# Patient Record
Sex: Male | Born: 1935 | Race: White | Hispanic: No | Marital: Married | Smoking: Never smoker
Health system: Southern US, Community
[De-identification: ages and names within clinical notes are randomized; demographics above are authoritative.]

## PROBLEM LIST (undated history)

## (undated) DIAGNOSIS — E079 Disorder of thyroid, unspecified: Secondary | ICD-10-CM

## (undated) HISTORY — PX: APPENDECTOMY: SHX54

## (undated) HISTORY — PX: TONSILLECTOMY: SUR1361

---

## 2013-01-13 ENCOUNTER — Encounter (HOSPITAL_COMMUNITY): Payer: Self-pay | Admitting: *Deleted

## 2013-01-13 ENCOUNTER — Emergency Department (HOSPITAL_COMMUNITY): Payer: Medicare Other

## 2013-01-13 ENCOUNTER — Emergency Department (HOSPITAL_COMMUNITY)
Admission: EM | Admit: 2013-01-13 | Discharge: 2013-01-13 | Disposition: A | Discharge status: Home / Self Care | Payer: Medicare Other | Attending: Emergency Medicine | Admitting: Emergency Medicine

## 2013-01-13 DIAGNOSIS — Z7982 Long term (current) use of aspirin: Secondary | ICD-10-CM | POA: Insufficient documentation

## 2013-01-13 DIAGNOSIS — E079 Disorder of thyroid, unspecified: Secondary | ICD-10-CM | POA: Insufficient documentation

## 2013-01-13 DIAGNOSIS — K5900 Constipation, unspecified: Secondary | ICD-10-CM

## 2013-01-13 DIAGNOSIS — R109 Unspecified abdominal pain: Secondary | ICD-10-CM

## 2013-01-13 DIAGNOSIS — Z79899 Other long term (current) drug therapy: Secondary | ICD-10-CM | POA: Insufficient documentation

## 2013-01-13 DIAGNOSIS — Z9089 Acquired absence of other organs: Secondary | ICD-10-CM | POA: Insufficient documentation

## 2013-01-13 DIAGNOSIS — R1011 Right upper quadrant pain: Secondary | ICD-10-CM | POA: Insufficient documentation

## 2013-01-13 DIAGNOSIS — R142 Eructation: Secondary | ICD-10-CM | POA: Insufficient documentation

## 2013-01-13 DIAGNOSIS — R141 Gas pain: Secondary | ICD-10-CM | POA: Insufficient documentation

## 2013-01-13 HISTORY — DX: Disorder of thyroid, unspecified: E07.9

## 2013-01-13 LAB — CBC WITH DIFFERENTIAL/PLATELET
Basophils Absolute: 0 K/uL (ref 0.0–0.1)
Basophils Relative: 1 % (ref 0–1)
Eosinophils Absolute: 0.1 K/uL (ref 0.0–0.7)
Eosinophils Relative: 2 % (ref 0–5)
HCT: 39 % (ref 39.0–52.0)
Hemoglobin: 13.6 g/dL (ref 13.0–17.0)
Lymphocytes Relative: 19 % (ref 12–46)
Lymphs Abs: 1 10*3/uL (ref 0.7–4.0)
MCH: 33.1 pg (ref 26.0–34.0)
MCHC: 34.9 g/dL (ref 30.0–36.0)
MCV: 94.9 fL (ref 78.0–100.0)
Monocytes Absolute: 0.4 K/uL (ref 0.1–1.0)
Monocytes Relative: 8 % (ref 3–12)
Neutro Abs: 3.7 10*3/uL (ref 1.7–7.7)
Neutrophils Relative %: 71 % (ref 43–77)
Platelets: 161 K/uL (ref 150–400)
RBC: 4.11 MIL/uL — ABNORMAL LOW (ref 4.22–5.81)
RDW: 12.5 % (ref 11.5–15.5)
WBC: 5.2 K/uL (ref 4.0–10.5)

## 2013-01-13 LAB — COMPREHENSIVE METABOLIC PANEL
Albumin: 3.6 g/dL (ref 3.5–5.2)
Alkaline Phosphatase: 55 U/L (ref 39–117)
BUN: 15 mg/dL (ref 6–23)
Chloride: 101 mEq/L (ref 96–112)
GFR calc Af Amer: >90 mL/min (ref >90)
Glucose, Bld: 100 mg/dL — ABNORMAL HIGH (ref 70–99)
Potassium: 4.4 mEq/L (ref 3.5–5.1)
Total Bilirubin: 0.3 mg/dL (ref 0.3–1.2)

## 2013-01-13 LAB — COMPREHENSIVE METABOLIC PANEL WITH GFR
ALT: 18 U/L (ref 0–53)
AST: 18 U/L (ref 0–37)
CO2: 29 meq/L (ref 19–32)
Calcium: 9.4 mg/dL (ref 8.4–10.5)
Creatinine, Ser: 0.83 mg/dL (ref 0.50–1.35)
GFR calc non Af Amer: 82 mL/min — ABNORMAL LOW (ref >90)
Sodium: 139 meq/L (ref 135–145)
Total Protein: 7.1 g/dL (ref 6.0–8.3)

## 2013-01-13 LAB — URINALYSIS, ROUTINE W REFLEX MICROSCOPIC
Bilirubin Urine: NEGATIVE
Glucose, UA: NEGATIVE mg/dL
Hgb urine dipstick: NEGATIVE
Ketones, ur: NEGATIVE mg/dL
Leukocytes, UA: NEGATIVE
Nitrite: NEGATIVE
Protein, ur: NEGATIVE mg/dL
Specific Gravity, Urine: 1.009 (ref 1.005–1.030)
Urobilinogen, UA: 0.2 mg/dL (ref 0.0–1.0)
pH: 7 (ref 5.0–8.0)

## 2013-01-13 LAB — OCCULT BLOOD, POC DEVICE: Fecal Occult Bld: NEGATIVE

## 2013-01-13 MED ORDER — POLYETHYLENE GLYCOL 3350 17 GM/SCOOP PO POWD: 17.0000 g | Freq: Every day | ORAL | Status: DC

## 2013-01-13 MED ORDER — DISPOSABLE ENEMA 19-7 GM/118ML RE ENEM: 1.0000 | ENEMA | Freq: Once | RECTAL | Status: DC

## 2013-01-13 MED ORDER — BISACODYL 10 MG RE SUPP
10.0000 mg | Freq: Once | RECTAL | Status: AC
  Administered 2013-01-13: 10 mg via RECTAL
  Filled 2013-01-13: qty 1

## 2013-01-13 MED ORDER — MAGNESIUM CITRATE PO SOLN
1.0000 | Freq: Once | ORAL | Status: AC
  Administered 2013-01-13: 1 via ORAL
  Filled 2013-01-13: qty 296

## 2013-01-13 MED ORDER — BISACODYL 5 MG PO TBEC
5.0000 mg | DELAYED_RELEASE_TABLET | Freq: Once | ORAL | Status: AC
  Administered 2013-01-13: 5 mg via ORAL
  Filled 2013-01-13: qty 1

## 2013-01-13 NOTE — ED Provider Notes (Signed)
History     CSN: [id number]  Arrival date & time 01/13/13  1437   First MD Initiated Contact with Patient 01/13/13 1518      No chief complaint on file.   (Consider location/radiation/quality/duration/timing/severity/associated sxs/prior treatment) HPI Comments: Patient reports that he has not had a bowel movement that was normal in the last 5 or 6 days. He normally goes daily possibly skips a day on occasion. He denies any significant prior problems with constipation. He reports he's been having an intermittent pain mostly on the right side that seems positional. He reports pain gets worse if he tries to roll and lay on his left side although it may ease off after a portion of time. He reports pain is not as bad if he lays on his back or sitting up. He denies nausea vomiting, fever or chills. He reports minimal abdominal distention. He is still passing flatus. He has had a few small pebble size bowel movements a few days ago. He has tried both a stool softener and some MiraLAX yesterday with no effect. He recalls that on the day that he began having some discomfort, he had been trying to cut a tree down with a chainsaw and was in a stooped position straining and thought that may have caused him to have some sort of bowel obstruction. Patient has a history of an appendectomy in the 1950s. Otherwise no other abdominal surgeries. He takes a medication, namely vitamins and otherwise takes medication for acid reflux and for hypothyroidism. Patient denies smoking or drinking.  The history is provided by the patient and the spouse.    Past Medical History  Diagnosis Date  . Thyroid disease     Past Surgical History  Procedure Laterality Date  . Appendectomy      History reviewed. No pertinent family history.  History  Substance Use Topics  . Smoking status: Never Smoker   . Smokeless tobacco: Not on file  . Alcohol Use: No      Review of Systems  Constitutional: Negative for fever  and chills.  Gastrointestinal: Positive for abdominal pain and constipation. Negative for nausea, vomiting and blood in stool.  Genitourinary: Negative for dysuria and flank pain.  Musculoskeletal: Negative for back pain.  All other systems reviewed and are negative.    Allergies  Review of patient's allergies indicates no known allergies.  Home Medications   Current Outpatient Rx  Name  Route  Sig  Dispense  Refill  . aspirin EC 81 MG tablet   Oral   Take 81 mg by mouth daily.         . Cholecalciferol (VITAMIN D PO)   Oral   Take 1 tablet by mouth daily.         . fish oil-omega-3 fatty acids 1000 MG capsule   Oral   Take 1 g by mouth daily.         . Ginkgo Biloba (GNP GINGKO BILOBA EXTRACT PO)   Oral   Take 1 capsule by mouth daily.         Marland Kitchen levothyroxine (SYNTHROID, LEVOTHROID) 75 MCG tablet   Oral   Take 75 mcg by mouth daily.         Marland Kitchen omeprazole (PRILOSEC) 20 MG capsule   Oral   Take 20 mg by mouth 2 (two) times daily.         . vitamin B-12 (CYANOCOBALAMIN) 1000 MCG tablet   Oral   Take 1,000 mcg by mouth daily.         Marland Kitchen  vitamin C (ASCORBIC ACID) 250 MG tablet   Oral   Take 250 mg by mouth daily.         . vitamin E 100 UNIT capsule   Oral   Take 100 Units by mouth daily.         . polyethylene glycol powder (MIRALAX) powder   Oral   Take 17 g by mouth daily.   255 g   0   . sodium phosphate (FLEET) enema   Rectal   Place 1 enema rectally once. follow package directions   135 mL   0     BP 108/79  Pulse 72  Temp(Src) 97.6 F (36.4 C) (Oral)  Resp 16  SpO2 98%  Physical Exam  Nursing note and vitals reviewed. Constitutional: He appears well-developed and well-nourished. He is cooperative.  Non-toxic appearance. He does not have a sickly appearance. No distress.  HENT:  Head: Normocephalic and atraumatic.  Eyes: No scleral icterus.  Cardiovascular: Normal rate, regular rhythm and intact distal pulses.     Pulmonary/Chest: Effort normal. No respiratory distress. He has no wheezes.  Abdominal: Soft. Normal appearance. He exhibits no distension. There is no hepatosplenomegaly. There is tenderness. There is no rebound, no guarding, no CVA tenderness, no tenderness at McBurney's point and negative Murphy's sign. No hernia.    Genitourinary: Rectal exam shows no external hemorrhoid and no internal hemorrhoid. Guaiac negative stool.  Chaperone present. Mild amount of firm stool felt on digital rectal examination without overt obstruction. No sig impaction.    Neurological: He is alert.  Skin: Skin is warm and dry. He is not diaphoretic.    ED Course  Procedures (including critical care time)  Labs Reviewed  CBC WITH DIFFERENTIAL - Abnormal; Notable for the following:    RBC 4.11 (*)    All other components within normal limits  COMPREHENSIVE METABOLIC PANEL - Abnormal; Notable for the following:    Glucose, Bld 100 (*)    GFR calc non Af Amer 82 (*)    All other components within normal limits  URINALYSIS, ROUTINE W REFLEX MICROSCOPIC  OCCULT BLOOD, POC DEVICE   Dg Abd Acute W/chest  01/13/2013  *RADIOLOGY REPORT*  Clinical Data: Pain in bilateral flanks with constipation for 5 days  ACUTE ABDOMEN SERIES (ABDOMEN 2 VIEW & CHEST 1 VIEW)  Comparison: None.  Findings: Normal heart size and pulmonary vascularity. Tortuous aorta. Lungs clear. No pleural effusion or pneumothorax. Increased stool throughout colon to rectum. No bowel dilatation, bowel wall thickening, or free intraperitoneal air. Osseous demineralization with compression deformities of multiple vertebral endplates on AP views.  Scattered costal cartilaginous calcifications. Additional left paraspinal calcification at the L3-L4 disc space level on supine view is likely located over the L4-L5 disc space on the AP view. This calcification is nonspecific and of uncertain etiology. This is not likely associated with urinary tract. No definite  urinary tract calcification.  IMPRESSION: Increased stool throughout colon, clinically consider constipation. Nonspecific left paraspinal calcifications at L3-L4, of uncertain etiology and clinical significance. These are not likely related to the urinary tract or the abdominal aorta. Calcified intra abdominal mass is not excluded; follow-up CT imaging with IV and oral contrast recommended.   Original Report Authenticated By: Ulyses Southward, M.D.      1. Constipation   2. Abdominal pain     ra sat is 98% and I interpret to be normal.  8:26 PM Clinically, I suspect constipation.  Pt received laxatives and I discussed all  findings from plain films with pt and spouse esp regarding need for outpt CT scan or MRI to delineate calcifications. Ca is normal here.  Abd is soft.  Will d/c home.  Pt told to return if pain worsens, is associated with N/V, or high fevers. Pt and spouse are in agreement.    MDM  Pt with no distention, no tympanitic BS, no report of N/V.  Abd pain is intermittent and positional and I think more consistent with pain associated with constipation.  Will check 3 view abd series. If this displays sig stool burden, esp in right side, will try laxatives here for a BM and to reassess for abd pain resolution.  Otherwise would obtain a CT Scan.          Gavin Pound. Oletta Lamas, MD 01/13/13 2042

## 2013-01-13 NOTE — Discharge Instructions (Signed)
Constipation, Adult  Constipation is when a person has fewer than 3 bowel movements a week; has difficulty having a bowel movement; or has stools that are dry, hard, or larger than normal. As people grow older, constipation is more common. If you try to fix constipation with medicines that make you have a bowel movement (laxatives), the problem may get worse. Long-term laxative use may cause the muscles of the colon to become weak. A low-fiber diet, not taking in enough fluids, and taking certain medicines may make constipation worse.  CAUSES    Certain medicines, such as antidepressants, pain medicine, iron supplements, antacids, and water pills.    Certain diseases, such as diabetes, irritable bowel syndrome (IBS), thyroid disease, or depression.    Not drinking enough water.    Not eating enough fiber-rich foods.    Stress or travel.   Lack of physical activity or exercise.   Not going to the restroom when there is the urge to have a bowel movement.   Ignoring the urge to have a bowel movement.   Using laxatives too much.  SYMPTOMS    Having fewer than 3 bowel movements a week.    Straining to have a bowel movement.    Having hard, dry, or larger than normal stools.    Feeling full or bloated.    Pain in the lower abdomen.   Not feeling relief after having a bowel movement.  DIAGNOSIS   Your caregiver will take a medical history and perform a physical exam. Further testing may be done for severe constipation. Some tests may include:    A barium enema X-ray to examine your rectum, colon, and sometimes, your small intestine.   A sigmoidoscopy to examine your lower colon.   A colonoscopy to examine your entire colon.  TREATMENT   Treatment will depend on the severity of your constipation and what is causing it. Some dietary treatments include drinking more fluids and eating more fiber-rich foods. Lifestyle treatments may include regular exercise. If these diet and lifestyle  recommendations do not help, your caregiver may recommend taking over-the-counter laxative medicines to help you have bowel movements. Prescription medicines may be prescribed if over-the-counter medicines do not work.   HOME CARE INSTRUCTIONS    Increase dietary fiber in your diet, such as fruits, vegetables, whole grains, and beans. Limit high-fat and processed sugars in your diet, such as [place] fries, hamburgers, cookies, candies, and soda.    A fiber supplement may be added to your diet if you cannot get enough fiber from foods.    Drink enough fluids to keep your urine clear or pale yellow.    Exercise regularly or as directed by your caregiver.    Go to the restroom when you have the urge to go. Do not hold it.   Only take medicines as directed by your caregiver. Do not take other medicines for constipation without talking to your caregiver first.  [place] IF:    You have bright red blood in your stool.    Your constipation lasts for more than 4 days or gets worse.    You have abdominal or rectal pain.    You have thin, pencil-like stools.   You have unexplained weight loss.  MAKE SURE YOU:    Understand these instructions.   Will watch your condition.   Will get help right away if you are not doing well or get worse.  Document Released: 07/21/2004 Document Revised: 01/15/2012 Document Reviewed:  09/26/2011  ExitCare Patient Information 2013 [employer].    Abdominal Pain  Abdominal pain can be caused by many things. Your caregiver decides the seriousness of your pain by an examination and possibly blood tests and X-rays. Many cases can be observed and treated at home. Most abdominal pain is not caused by a disease and will probably improve without treatment. However, in many cases, more time must pass before a clear cause of the pain can be found. Before that point, it may not be known if you need more testing, or if hospitalization or surgery is needed.  HOME CARE INSTRUCTIONS     Do not take laxatives unless directed by your caregiver.   Take pain medicine only as directed by your caregiver.   Only take over-the-counter or prescription medicines for pain, discomfort, or fever as directed by your caregiver.   Try a clear liquid diet (broth, tea, or water) for as long as directed by your caregiver. Slowly move to a bland diet as tolerated.  SEEK IMMEDIATE MEDICAL CARE IF:    The pain does not go away.   You have a fever.   You keep throwing up (vomiting).   The pain is felt only in portions of the abdomen. Pain in the right side could possibly be appendicitis. In an adult, pain in the left lower portion of the abdomen could be colitis or diverticulitis.   You pass bloody or black tarry stools.  MAKE SURE YOU:    Understand these instructions.   Will watch your condition.   Will get help right away if you are not doing well or get worse.  Document Released: 08/02/2005 Document Revised: 01/15/2012 Document Reviewed: 06/10/2008  Exit[employer] Patient Information 2013 [employer].

## 2013-01-13 NOTE — ED Notes (Signed)
Pt states last Wednesday was working in some wood cutting a tree down.  Pt started having right and lower back pain.  Pt states that he has not had a good bowel movement in the last 5-7 days.  Pt states when he lays down he hurts.  No problems urinating.

## 2013-02-12 ENCOUNTER — Emergency Department (HOSPITAL_COMMUNITY): Payer: Medicare Other

## 2013-02-12 ENCOUNTER — Emergency Department (HOSPITAL_COMMUNITY)
Admission: EM | Admit: 2013-02-12 | Discharge: 2013-02-12 | Disposition: A | Discharge status: Home / Self Care | Payer: Medicare Other | Attending: Emergency Medicine | Admitting: Emergency Medicine

## 2013-02-12 ENCOUNTER — Encounter (HOSPITAL_COMMUNITY): Payer: Self-pay | Admitting: Emergency Medicine

## 2013-02-12 DIAGNOSIS — R42 Dizziness and giddiness: Secondary | ICD-10-CM | POA: Insufficient documentation

## 2013-02-12 DIAGNOSIS — R209 Unspecified disturbances of skin sensation: Secondary | ICD-10-CM | POA: Insufficient documentation

## 2013-02-12 DIAGNOSIS — Z79899 Other long term (current) drug therapy: Secondary | ICD-10-CM | POA: Insufficient documentation

## 2013-02-12 DIAGNOSIS — Z7982 Long term (current) use of aspirin: Secondary | ICD-10-CM | POA: Insufficient documentation

## 2013-02-12 DIAGNOSIS — E079 Disorder of thyroid, unspecified: Secondary | ICD-10-CM | POA: Insufficient documentation

## 2013-02-12 DIAGNOSIS — R110 Nausea: Secondary | ICD-10-CM | POA: Insufficient documentation

## 2013-02-12 LAB — COMPREHENSIVE METABOLIC PANEL
Albumin: 4 g/dL (ref 3.5–5.2)
BUN: 15 mg/dL (ref 6–23)
Calcium: 9.7 mg/dL (ref 8.4–10.5)
Creatinine, Ser: 0.85 mg/dL (ref 0.50–1.35)
Potassium: 4 mEq/L (ref 3.5–5.1)
Total Protein: 7.3 g/dL (ref 6.0–8.3)

## 2013-02-12 LAB — CBC WITH DIFFERENTIAL/PLATELET
Basophils Relative: 0 % (ref 0–1)
Eosinophils Absolute: 0 10*3/uL (ref 0.0–0.7)
Eosinophils Relative: 0 % (ref 0–5)
Hemoglobin: 13.5 g/dL (ref 13.0–17.0)
MCH: 32.4 pg (ref 26.0–34.0)
MCHC: 34.9 g/dL (ref 30.0–36.0)
MCV: 92.8 fL (ref 78.0–100.0)
Monocytes Absolute: 0.3 10*3/uL (ref 0.1–1.0)
Monocytes Relative: 4 % (ref 3–12)
Neutrophils Relative %: 89 % — ABNORMAL HIGH (ref 43–77)

## 2013-02-12 LAB — URINALYSIS, ROUTINE W REFLEX MICROSCOPIC
Glucose, UA: NEGATIVE mg/dL
Leukocytes, UA: NEGATIVE
Nitrite: NEGATIVE
Specific Gravity, Urine: 1.018 (ref 1.005–1.030)
pH: 6.5 (ref 5.0–8.0)

## 2013-02-12 LAB — LIPASE, BLOOD: Lipase: 30 U/L (ref 11–59)

## 2013-02-12 MED ORDER — PROMETHAZINE HCL 25 MG PO TABS: 25.0000 mg | ORAL_TABLET | Freq: Four times a day (QID) | ORAL | Status: AC | PRN

## 2013-02-12 NOTE — ED Notes (Addendum)
PT c/o acute onset nausea with dizziness at approx 1730 this evening. Pt also states his lip was numb on the drive here but that has subsided. Pt denies V/D, SOB, CP, fever/chills

## 2013-02-12 NOTE — ED Provider Notes (Signed)
History     CSN: [id number]  Arrival date & time 02/12/13  1945   First MD Initiated Contact with Patient 02/12/13 2117      Chief Complaint  Patient presents with  . Nausea    (Consider location/radiation/quality/duration/timing/severity/associated sxs/prior treatment) HPI Comments: This is a 79 year old patient who presents today with nausea. The nausea began suddenly at 445 pm. He states at this time he had some perioral numbness that resolved over half an hour. He gets lightheaded when he moves. Laying still helps this. No associated SOB, chest pain, weakness, headache, abdominal pain, vomiting. When he was in the car coming to the ED from [place] he was holding a basin and his hand went numb. That has also resolved. 2 weeks ago he began taking Colase and he wonders if he is allergic. Currently he feels well other than some mild nausea.   The history is provided by the patient. No language interpreter was used.    Past Medical History  Diagnosis Date  . Thyroid disease     Past Surgical History  Procedure Laterality Date  . Appendectomy    . Tonsillectomy      No family history on file.  History  Substance Use Topics  . Smoking status: Never Smoker   . Smokeless tobacco: Not on file  . Alcohol Use: No      Review of Systems  Respiratory: Negative for chest tightness and shortness of breath.   Cardiovascular: Negative for chest pain.  Gastrointestinal: Positive for nausea. Negative for vomiting and abdominal pain.  Neurological: Positive for numbness. Negative for syncope, weakness and headaches.  All other systems reviewed and are negative.    Allergies  Review of patient's allergies indicates no known allergies.  Home Medications   Current Outpatient Rx  Name  Route  Sig  Dispense  Refill  . aspirin EC 81 MG tablet   Oral   Take 81 mg by mouth daily.         . Cholecalciferol (VITAMIN D PO)   Oral   Take 1 tablet by mouth daily.         .  fish oil-omega-3 fatty acids 1000 MG capsule   Oral   Take 1 g by mouth daily.         . Ginkgo Biloba (GNP GINGKO BILOBA EXTRACT PO)   Oral   Take 1 capsule by mouth daily.         Marland Kitchen levothyroxine (SYNTHROID, LEVOTHROID) 75 MCG tablet   Oral   Take 75 mcg by mouth daily.         Marland Kitchen omeprazole (PRILOSEC) 20 MG capsule   Oral   Take 20 mg by mouth 2 (two) times daily.         . polyethylene glycol powder (MIRALAX) powder   Oral   Take 17 g by mouth daily.   255 g   0   . sodium phosphate (FLEET) enema   Rectal   Place 1 enema rectally once. follow package directions   135 mL   0   . vitamin B-12 (CYANOCOBALAMIN) 1000 MCG tablet   Oral   Take 1,000 mcg by mouth daily.         . vitamin C (ASCORBIC ACID) 250 MG tablet   Oral   Take 250 mg by mouth daily.         . vitamin E 100 UNIT capsule   Oral   Take 100 Units by mouth  daily.           BP 118/52  Pulse 76  Temp(Src) 97.4 F (36.3 C) (Oral)  Resp 14  SpO2 99%  Physical Exam  Nursing note and vitals reviewed. Constitutional: He is oriented to person, place, and time. He appears well-developed and well-nourished. No distress.  HENT:  Head: Normocephalic and atraumatic.  Right Ear: External ear normal.  Left Ear: External ear normal.  Nose: Nose normal.  Eyes: Conjunctivae and EOM are normal. Pupils are equal, round, and reactive to light.  Neck: Normal range of motion. No tracheal deviation present.  Cardiovascular: Normal rate, regular rhythm, normal heart sounds and intact distal pulses.   Pulmonary/Chest: Effort normal and breath sounds normal. No stridor. No respiratory distress. He has no wheezes. He has no rales.  Abdominal: Soft. Bowel sounds are normal. He exhibits no distension. There is no tenderness.  Musculoskeletal: Normal range of motion.  Neurological: He is alert and oriented to person, place, and time. He has normal strength and normal reflexes. He displays normal reflexes. No  cranial nerve deficit (III-XII) or sensory deficit. He exhibits normal muscle tone. Coordination and gait normal.  Skin: Skin is warm and dry. He is not diaphoretic.  Psychiatric: He has a normal mood and affect. His behavior is normal.    ED Course  Procedures (including critical care time)  Labs Reviewed  CBC WITH DIFFERENTIAL - Abnormal; Notable for the following:    RBC 4.17 (*)    HCT 38.7 (*)    Neutrophils Relative 89 (*)    Lymphocytes Relative 6 (*)    Lymphs Abs 0.5 (*)    All other components within normal limits  COMPREHENSIVE METABOLIC PANEL - Abnormal; Notable for the following:    Glucose, Bld 134 (*)    GFR calc non Af Amer 81 (*)    All other components within normal limits  URINALYSIS, ROUTINE W REFLEX MICROSCOPIC - Abnormal; Notable for the following:    APPearance CLOUDY (*)    Ketones, ur 15 (*)    All other components within normal limits  LIPASE, BLOOD  TROPONIN I   Ct Head Wo Contrast  02/12/2013  *RADIOLOGY REPORT*  Clinical Data: New onset nausea and dizziness.  CT HEAD WITHOUT CONTRAST  Technique:  Contiguous axial images were obtained from the base of the skull through the vertex without contrast.  Comparison: None.  Findings: Mild generalized atrophy is likely within normal limits for age.  There is minimal white matter disease.  No acute cortical infarct, hemorrhage, mass lesion is present.  The ventricles are of normal size.  No significant extra-axial fluid collection is present.  The paranasal sinuses and mastoid air cells are clear.  The osseous skull is intact.  IMPRESSION:  1.  Mild atrophy white matter disease is likely within normal limits for age. 2.  No acute intracranial abnormality.   Original Report Authenticated By: Marin Roberts, M.D.      1. Vertigo   2. Nausea       MDM  Patient presents today with sudden onset of nausea associated with perioral numbness. Room spins with movement and position change. Perioral numbness has  resolved. Neuro exam WNL. Troponin negative. CT of head negative for acute pathology. Low concern for TIA. No concern for stroke. Suspect vertigo. Tolerated ambulation well prior to discharge. Follow up with PCP. Dr. Bebe Shaggy saw this patient and agrees with plan. Return precautions given. Vital signs stable for discharge. Patient / Family / Caregiver  informed of clinical course, understand medical decision-making process, and agree with plan.         Mora Bellman, PA-C 02/13/13 1135

## 2013-02-12 NOTE — ED Notes (Signed)
Pt comfortable with d/c and f/u instructions. Prescriptions x1 

## 2013-02-12 NOTE — ED Notes (Signed)
PT. REPORTS NAUSEA ONSET THIS AFTERNOON , DENIES EMESIS OR DIARRHEA , NO PAIN OR SOB . NO FEVER OR CHILLS.

## 2013-02-12 NOTE — Discharge Instructions (Signed)
Benign Positional Vertigo  Vertigo means you feel like you or your surroundings are moving when they are not. Benign positional vertigo is the most common form of vertigo. Benign means that the cause of your condition is not serious. Benign positional vertigo is more common in older adults.  CAUSES   Benign positional vertigo is the result of an upset in the labyrinth system. This is an area in the middle ear that helps control your balance. This may be caused by a viral infection, head injury, or repetitive motion. However, often no specific cause is found.  SYMPTOMS   Symptoms of benign positional vertigo occur when you move your head or eyes in different directions. Some of the symptoms may include:  · Loss of balance and falls.  · Vomiting.  · Blurred vision.  · Dizziness.  · Nausea.  · Involuntary eye movements (nystagmus).  DIAGNOSIS   Benign positional vertigo is usually diagnosed by physical exam. If the specific cause of your benign positional vertigo is unknown, your caregiver may perform imaging tests, such as magnetic resonance imaging (MRI) or computed tomography (CT).  TREATMENT   Your caregiver may recommend movements or procedures to correct the benign positional vertigo. Medicines such as meclizine, benzodiazepines, and medicines for nausea may be used to treat your symptoms. In rare cases, if your symptoms are caused by certain conditions that affect the inner ear, you may need surgery.  HOME CARE INSTRUCTIONS   · Follow your caregiver's instructions.  · Move slowly. Do not make sudden body or head movements.  · Avoid driving.  · Avoid operating heavy machinery.  · Avoid performing any tasks that would be dangerous to you or others during a vertigo episode.  · Drink enough fluids to keep your urine clear or pale yellow.  SEEK IMMEDIATE MEDICAL CARE IF:   · You develop problems with walking, weakness, numbness, or using your arms, hands, or legs.  · You have difficulty speaking.  · You develop  severe headaches.  · Your nausea or vomiting continues or gets worse.  · You develop visual changes.  · Your family or friends notice any behavioral changes.  · Your condition gets worse.  · You have a fever.  · You develop a stiff neck or sensitivity to light.  MAKE SURE YOU:   · Understand these instructions.  · Will watch your condition.  · Will get help right away if you are not doing well or get worse.  Document Released: 07/31/2006 Document Revised: 01/15/2012 Document Reviewed: 07/13/2011  ExitCare® Patient Information ©2013 [employer].

## 2013-02-12 NOTE — ED Notes (Signed)
Dr. Wickline at bedside.  

## 2013-02-12 NOTE — ED Notes (Signed)
Pt returned from CT °

## 2013-02-12 NOTE — ED Notes (Signed)
EDPA at bedside for assessment 

## 2013-02-12 NOTE — ED Notes (Signed)
Ambulated pt around Pod pt said that he didn't feel dizzy or nauseous. Stated he felt a little weak in the knees but said it was probably from laying in the bed.

## 2013-02-14 NOTE — ED Provider Notes (Signed)
Medical screening examination/treatment/procedure(s) were conducted as a shared visit with non-physician practitioner(s) and myself.  I personally evaluated the patient during the encounter  Pt well appearing, on further review symptoms seemed c/w peripheral vertigo, no past pointing on my exam and he was ambulatory, doubt central cause of vertigo, doubt CVA.  Doubt ACS after further discussion, further troponin testing not necessary     Date: 02/12/2013  Rate: 72  Rhythm: normal sinus rhythm  QRS Axis: normal  Intervals: PR prolonged  ST/T Wave abnormalities: normal  Conduction Disutrbances:first-degree A-V block      Joya Gaskins, MD 02/14/13 [id number]

## 2015-02-22 ENCOUNTER — Emergency Department (HOSPITAL_COMMUNITY): Payer: Medicare Other

## 2015-02-22 ENCOUNTER — Encounter (HOSPITAL_COMMUNITY): Payer: Self-pay | Admitting: Emergency Medicine

## 2015-02-22 ENCOUNTER — Emergency Department (HOSPITAL_COMMUNITY)
Admission: EM | Admit: 2015-02-22 | Discharge: 2015-02-22 | Disposition: A | Discharge status: Home / Self Care | Payer: Medicare Other | Attending: Emergency Medicine | Admitting: Emergency Medicine

## 2015-02-22 DIAGNOSIS — S22009A Unspecified fracture of unspecified thoracic vertebra, initial encounter for closed fracture: Secondary | ICD-10-CM | POA: Diagnosis not present

## 2015-02-22 DIAGNOSIS — Z79899 Other long term (current) drug therapy: Secondary | ICD-10-CM | POA: Insufficient documentation

## 2015-02-22 DIAGNOSIS — Y999 Unspecified external cause status: Secondary | ICD-10-CM | POA: Insufficient documentation

## 2015-02-22 DIAGNOSIS — R109 Unspecified abdominal pain: Secondary | ICD-10-CM

## 2015-02-22 DIAGNOSIS — W1839XA Other fall on same level, initial encounter: Secondary | ICD-10-CM | POA: Diagnosis not present

## 2015-02-22 DIAGNOSIS — S3991XA Unspecified injury of abdomen, initial encounter: Secondary | ICD-10-CM | POA: Diagnosis not present

## 2015-02-22 DIAGNOSIS — Y929 Unspecified place or not applicable: Secondary | ICD-10-CM | POA: Insufficient documentation

## 2015-02-22 DIAGNOSIS — Z7982 Long term (current) use of aspirin: Secondary | ICD-10-CM | POA: Insufficient documentation

## 2015-02-22 DIAGNOSIS — E079 Disorder of thyroid, unspecified: Secondary | ICD-10-CM | POA: Insufficient documentation

## 2015-02-22 DIAGNOSIS — Y939 Activity, unspecified: Secondary | ICD-10-CM | POA: Insufficient documentation

## 2015-02-22 DIAGNOSIS — S299XXA Unspecified injury of thorax, initial encounter: Secondary | ICD-10-CM | POA: Diagnosis present

## 2015-02-22 LAB — CBC WITH DIFFERENTIAL/PLATELET
BASOS PCT: 0 % (ref 0–1)
Basophils Absolute: 0 10*3/uL (ref 0.0–0.1)
Eosinophils Absolute: 0.1 10*3/uL (ref 0.0–0.7)
Eosinophils Relative: 2 % (ref 0–5)
HEMATOCRIT: 38.6 % — AB (ref 39.0–52.0)
HEMOGLOBIN: 13 g/dL (ref 13.0–17.0)
LYMPHS ABS: 1.2 10*3/uL (ref 0.7–4.0)
LYMPHS PCT: 23 % (ref 12–46)
MCH: 32.7 pg (ref 26.0–34.0)
MCHC: 33.7 g/dL (ref 30.0–36.0)
MCV: 97.2 fL (ref 78.0–100.0)
MONO ABS: 0.4 10*3/uL (ref 0.1–1.0)
MONOS PCT: 8 % (ref 3–12)
NEUTROS ABS: 3.3 10*3/uL (ref 1.7–7.7)
Neutrophils Relative %: 67 % (ref 43–77)
Platelets: 165 10*3/uL (ref 150–400)
RBC: 3.97 MIL/uL — AB (ref 4.22–5.81)
RDW: 12.5 % (ref 11.5–15.5)
WBC: 4.9 10*3/uL (ref 4.0–10.5)

## 2015-02-22 LAB — URINALYSIS, ROUTINE W REFLEX MICROSCOPIC
BILIRUBIN URINE: NEGATIVE
Glucose, UA: NEGATIVE mg/dL
Hgb urine dipstick: NEGATIVE
KETONES UR: NEGATIVE mg/dL
Leukocytes, UA: NEGATIVE
NITRITE: NEGATIVE
PROTEIN: NEGATIVE mg/dL
Specific Gravity, Urine: 1.005 (ref 1.005–1.030)
UROBILINOGEN UA: 0.2 mg/dL (ref 0.0–1.0)
pH: 7 (ref 5.0–8.0)

## 2015-02-22 LAB — COMPREHENSIVE METABOLIC PANEL
ALBUMIN: 3.7 g/dL (ref 3.5–5.2)
ALK PHOS: 52 U/L (ref 39–117)
ALT: 13 U/L (ref 0–53)
AST: 22 U/L (ref 0–37)
Anion gap: 10 (ref 5–15)
BUN: 12 mg/dL (ref 6–23)
CALCIUM: 9.3 mg/dL (ref 8.4–10.5)
CHLORIDE: 101 mmol/L (ref 96–112)
CO2: 27 mmol/L (ref 19–32)
CREATININE: 0.95 mg/dL (ref 0.50–1.35)
GFR, EST AFRICAN AMERICAN: 88 mL/min — AB (ref >90)
GFR, EST NON AFRICAN AMERICAN: 76 mL/min — AB (ref >90)
Glucose, Bld: 103 mg/dL — ABNORMAL HIGH (ref 70–99)
POTASSIUM: 4.4 mmol/L (ref 3.5–5.1)
Sodium: 138 mmol/L (ref 135–145)
TOTAL PROTEIN: 6.6 g/dL (ref 6.0–8.3)
Total Bilirubin: 0.6 mg/dL (ref 0.3–1.2)

## 2015-02-22 MED ORDER — POLYETHYLENE GLYCOL 3350 17 GM/SCOOP PO POWD: 17.0000 g | Freq: Two times a day (BID) | ORAL | Status: AC

## 2015-02-22 MED ORDER — DOCUSATE SODIUM 100 MG PO CAPS: 100.0000 mg | ORAL_CAPSULE | Freq: Two times a day (BID) | ORAL | Status: AC

## 2015-02-22 MED ORDER — IOHEXOL 300 MG/ML  SOLN
100.0000 mL | Freq: Once | INTRAMUSCULAR | Status: AC | PRN
  Administered 2015-02-22: 100 mL via INTRAVENOUS

## 2015-02-22 MED ORDER — IOHEXOL 300 MG/ML  SOLN
25.0000 mL | Freq: Once | INTRAMUSCULAR | Status: AC | PRN
  Administered 2015-02-22: 25 mL via ORAL

## 2015-02-22 MED ORDER — FENTANYL CITRATE (PF) 100 MCG/2ML IJ SOLN
50.0000 ug | INTRAMUSCULAR | Status: DC | PRN
  Administered 2015-02-22: 50 ug via INTRAVENOUS
  Filled 2015-02-22: qty 2

## 2015-02-22 MED ORDER — DOCUSATE SODIUM 100 MG PO CAPS: 100.0000 mg | ORAL_CAPSULE | Freq: Once | ORAL | Status: DC

## 2015-02-22 MED ORDER — POLYETHYLENE GLYCOL 3350 17 G PO PACK: 17.0000 g | PACK | Freq: Every day | ORAL | Status: DC

## 2015-02-22 NOTE — ED Notes (Signed)
Pt c/o left sided abd pain near rib area x 3 weeks since fell 3 weeks ago; pt sts some intermittent abd pain that is severe

## 2015-02-22 NOTE — ED Provider Notes (Signed)
CSN: [id number]     Arrival date & time 02/22/15  1421 History   First MD Initiated Contact with Patient 02/22/15 1755     Chief Complaint  Patient presents with  . Abdominal Pain     (Consider location/radiation/quality/duration/timing/severity/associated sxs/prior Treatment) HPI Comments: The patient is a 81 year old male, he had a fall approximately 3 weeks ago onto his left side against a pile of stacked firewood, he felt acute onset of pain and has had ongoing pain in that area since then. No pain with deep breathing, significant pain with any change in position from sitting to laying or vice versa. He denies fevers chills coughing diarrhea dysuria or rectal bleeding or pain or swelling of the arms or legs. He also has right-sided pain though he states it is not as bad as the left. He denies any prior abdominal surgical history other than appendicitis when he was 81 years old  Patient is a 81 y.o. male presenting with abdominal pain. The history is provided by the patient and the spouse.  Abdominal Pain   Past Medical History  Diagnosis Date  . Thyroid disease    Past Surgical History  Procedure Laterality Date  . Appendectomy    . Tonsillectomy     History reviewed. No pertinent family history. History  Substance Use Topics  . Smoking status: Never Smoker   . Smokeless tobacco: Not on file  . Alcohol Use: No    Review of Systems  Gastrointestinal: Positive for abdominal pain.  All other systems reviewed and are negative.     Allergies  Review of patient's allergies indicates no known allergies.  Home Medications   Prior to Admission medications   Medication Sig Start Date End Date Taking? Authorizing Provider  aspirin EC 81 MG tablet Take 81 mg by mouth daily.   Yes Historical Provider, MD  Cholecalciferol (VITAMIN D) 2000 UNITS tablet Take 4,000 Units by mouth daily.   Yes Historical Provider, MD  fish oil-omega-3 fatty acids 1000 MG capsule Take 1 g by mouth  daily.   Yes Historical Provider, MD  ibuprofen (ADVIL,MOTRIN) 200 MG tablet Take 200 mg by mouth every 6 (six) hours as needed for mild pain or moderate pain.   Yes Historical Provider, MD  levothyroxine (SYNTHROID, LEVOTHROID) 75 MCG tablet Take 75 mcg by mouth daily.   Yes Historical Provider, MD  naproxen sodium (ANAPROX) 220 MG tablet Take 440 mg by mouth 2 (two) times daily as needed (for pain).   Yes Historical Provider, MD  omeprazole (PRILOSEC) 20 MG capsule Take 20 mg by mouth 2 (two) times daily.   Yes Historical Provider, MD  vitamin B-12 (CYANOCOBALAMIN) 1000 MCG tablet Take 1,000 mcg by mouth daily.   Yes Historical Provider, MD  vitamin C (ASCORBIC ACID) 500 MG tablet Take 1,000 mg by mouth daily.   Yes Historical Provider, MD  vitamin E 100 UNIT capsule Take 100 Units by mouth daily.   Yes Historical Provider, MD  Wheat Dextrin (BENEFIBER DRINK MIX PO) Take 2 scoop by mouth daily.   Yes Historical Provider, MD  docusate sodium (COLACE) 100 MG capsule Take 1 capsule (100 mg total) by mouth every 12 (twelve) hours. 02/22/15   Eber HongBrian Ella Guillotte, MD  polyethylene glycol powder (GLYCOLAX/MIRALAX) powder Take 17 g by mouth 2 (two) times daily. Until daily soft stools  OTC 02/22/15   Eber HongBrian Evangelene Vora, MD  promethazine (PHENERGAN) 25 MG tablet Take 1 tablet (25 mg total) by mouth every 6 (six) hours as  needed for nausea. Patient not taking: Reported on 02/22/2015 02/12/13   Junious Silk, PA-C   BP 118/70 mmHg  Pulse 73  Temp(Src) 97.5 F (36.4 C) (Oral)  Resp 18  SpO2 99% Physical Exam  Constitutional: He appears well-developed and well-nourished. No distress.  HENT:  Head: Normocephalic and atraumatic.  Mouth/Throat: Oropharynx is clear and moist. No oropharyngeal exudate.  Eyes: Conjunctivae and EOM are normal. Pupils are equal, round, and reactive to light. Right eye exhibits no discharge. Left eye exhibits no discharge. No scleral icterus.  Neck: Normal range of motion. Neck supple. No  JVD present. No thyromegaly present.  Cardiovascular: Normal rate, regular rhythm, normal heart sounds and intact distal pulses.  Exam reveals no gallop and no friction rub.   No murmur heard. Pulmonary/Chest: Effort normal and breath sounds normal. No respiratory distress. He has no wheezes. He has no rales. He exhibits tenderness ( Moderate to severe tenderness to palpation over the right posterolateral ribs as well as the left posterior lateral ribs approximately 9 through 11. No crepitance or subcutaneous emphysema).  Abdominal: Soft. Bowel sounds are normal. He exhibits no distension and no mass. There is tenderness ( Mild left upper quadrant and left-sided tenderness to palpation, no pain at McBurney's point, no Murphy sign).  Musculoskeletal: Normal range of motion. He exhibits no edema or tenderness.  Lymphadenopathy:    He has no cervical adenopathy.  Neurological: He is alert. Coordination normal.  Skin: Skin is warm and dry. No rash noted. No erythema.  Psychiatric: He has a normal mood and affect. His behavior is normal.  Nursing note and vitals reviewed.   ED Course  Procedures (including critical care time) Labs Review Labs Reviewed  CBC WITH DIFFERENTIAL/PLATELET - Abnormal; Notable for the following:    RBC 3.97 (*)    HCT 38.6 (*)    All other components within normal limits  COMPREHENSIVE METABOLIC PANEL - Abnormal; Notable for the following:    Glucose, Bld 103 (*)    GFR calc non Af Amer 76 (*)    GFR calc Af Amer 88 (*)    All other components within normal limits  URINALYSIS, ROUTINE W REFLEX MICROSCOPIC    Imaging Review Dg Ribs Unilateral W/chest Left  02/22/2015   CLINICAL DATA:  Abdominal pain. Left-sided abdominal pain near the rib area for 3 weeks. Patient fell 3 weeks ago. Symptoms since that time.  EXAM: LEFT RIBS AND CHEST - 3+ VIEW  COMPARISON:  01/13/2013  FINDINGS: Heart size is normal. The lungs are clear. No pneumothorax. There is elevation of the  left hemidiaphragm. Numerous nondisplaced rib fractures are suspected, involving the left ninth, tenth, eleventh, and twelfth ribs. There is no evidence for pneumothorax. There is gaseous distension of numerous bowel loops.  IMPRESSION: 1. Suspect acute minimally displaced fractures of the night through twelfth ribs. 2. No pneumothorax.   Electronically Signed   By: Norva Pavlov M.D.   On: 02/22/2015 16:57   Ct Abdomen Pelvis W Contrast  02/22/2015   CLINICAL DATA:  Left-sided abdominal pain, 3 weeks duration. Fell 3 weeks ago.  EXAM: CT ABDOMEN AND PELVIS WITH CONTRAST  TECHNIQUE: Multidetector CT imaging of the abdomen and pelvis was performed using the standard protocol following bolus administration of intravenous contrast.  CONTRAST:  25mL OMNIPAQUE IOHEXOL 300 MG/ML SOLN, OMNIPAQUE IOHEXOL 300 MG/ML SOLN  COMPARISON:  Radiography same day  FINDINGS: There is slight elevation of the left hemidiaphragm with volume loss of the left lung  base. No pleural fluid. There is a calcified granuloma in the left lower lobe posteriorly.  The liver contains a few benign appearing cysts, the largest in the left lobe measuring 1 cm in diameter. No calcified gallstones. The spleen is normal. The pancreas is normal. The adrenal glands are normal. The kidneys are normal. No cyst, mass, stone or hydronephrosis. The aorta shows atherosclerosis but no aneurysm. The IVC is normal. There is calcification within the root of the mesenteries which is nonspecific and probably relates to old inflammatory disease. A few other calcified mesenteric nodes are seen scattered within the abdomen. There is no free fluid or air. There is a fairly large amount of stool and gas within the colon. I do not see any evidence of rib fracture. There are compression fractures seen throughout the lower thoracic and the lumbar spine. Fractures at T9 and T11 could be recent or recently extended. No sign of canal compromise.  IMPRESSION: No acute  intra abdominal process identified. Fairly large amount of gas and stool within the colon but no suspicion of obstruction.  Calcified nodes in the mesenteries, presumably relating to old inflammatory disease.  Mild volume loss at the left base.  Multiple spinal compression fractures with suspicion of acute fracture or extension at T9 and T11. No canal compromise.  No rib fracture seen.   Electronically Signed   By: Paulina Fusi M.D.   On: 02/22/2015 20:40      MDM   Final diagnoses:  Abdominal pain in male  Fracture of thoracic spine, closed, initial encounter    Normal lung sounds, no tenderness over the spine, normal vital signs, no hypoxia. He does not appear to be in distress, his x-rays do show minimally displaced fractures of the right 9 through 12th ribs, there is no pneumothorax, he has also significant tenderness on the left as well as mild left upper quadrant tenderness. Evaluate for occult spleen injury, rib fractures, CT scan. The patient declines pain medications at this time, fentanyl as needed prior to CT scan.  CT scan confirms multiple spinal compression fractures especially with acute fractures of T9 and T11, there is no spinal canal compromise, there is no other intra-abdominal process, he does have a large amount of stool and gas, we'll start on stool softeners which will help minimize pain by straining as well. The patient has been informed of all of his results and the treatment protocol and is in agreement with the plan. He declined stronger pain medications and wishes to continue to take ibuprofen as needed. He will be given referral to interventional radiology for possible kyphoplasty.   Eber Hong, MD 02/22/15 [phone]

## 2015-02-22 NOTE — Discharge Instructions (Signed)
Please call your doctor for a followup appointment within 24-48 hours. When you talk to your doctor please let them know that you were seen in the emergency department and have them acquire all of your records so that they can discuss the findings with you and formulate a treatment plan to fully care for your new and ongoing problems. ° °

## 2015-02-22 NOTE — ED Notes (Signed)
Patient unable to give urine sample at this time

## 2015-02-23 ENCOUNTER — Other Ambulatory Visit (HOSPITAL_COMMUNITY): Payer: Self-pay | Admitting: Interventional Radiology

## 2015-02-23 DIAGNOSIS — IMO0002 Reserved for concepts with insufficient information to code with codable children: Secondary | ICD-10-CM

## 2015-02-23 DIAGNOSIS — M549 Dorsalgia, unspecified: Secondary | ICD-10-CM

## 2015-02-24 ENCOUNTER — Ambulatory Visit (HOSPITAL_COMMUNITY): Admission: RE | Admit: 2015-02-24 | Payer: Medicare Other | Source: Ambulatory Visit

## 2015-05-31 ENCOUNTER — Ambulatory Visit (HOSPITAL_COMMUNITY)
Admission: RE | Admit: 2015-05-31 | Discharge: 2015-05-31 | Disposition: A | Discharge status: Home / Self Care | Payer: Medicare Other | Source: Ambulatory Visit | Attending: Interventional Radiology | Admitting: Interventional Radiology

## 2015-05-31 ENCOUNTER — Telehealth (HOSPITAL_COMMUNITY): Payer: Self-pay

## 2015-05-31 DIAGNOSIS — IMO0002 Reserved for concepts with insufficient information to code with codable children: Secondary | ICD-10-CM

## 2015-05-31 DIAGNOSIS — M549 Dorsalgia, unspecified: Secondary | ICD-10-CM

## 2016-12-24 ENCOUNTER — Emergency Department (HOSPITAL_COMMUNITY): Payer: Medicare Other

## 2016-12-24 ENCOUNTER — Emergency Department (HOSPITAL_COMMUNITY)
Admission: EM | Admit: 2016-12-24 | Discharge: 2016-12-24 | Disposition: A | Discharge status: Home / Self Care | Payer: Medicare Other | Attending: Emergency Medicine | Admitting: Emergency Medicine

## 2016-12-24 DIAGNOSIS — Z79899 Other long term (current) drug therapy: Secondary | ICD-10-CM | POA: Diagnosis not present

## 2016-12-24 DIAGNOSIS — Z7982 Long term (current) use of aspirin: Secondary | ICD-10-CM | POA: Insufficient documentation

## 2016-12-24 DIAGNOSIS — M6281 Muscle weakness (generalized): Secondary | ICD-10-CM | POA: Diagnosis not present

## 2016-12-24 DIAGNOSIS — R531 Weakness: Secondary | ICD-10-CM

## 2016-12-24 LAB — CBC
HEMATOCRIT: 40.9 % (ref 39.0–52.0)
HEMOGLOBIN: 13.8 g/dL (ref 13.0–17.0)
MCH: 32.5 pg (ref 26.0–34.0)
MCHC: 33.7 g/dL (ref 30.0–36.0)
MCV: 96.5 fL (ref 78.0–100.0)
Platelets: 152 10*3/uL (ref 150–400)
RBC: 4.24 MIL/uL (ref 4.22–5.81)
RDW: 12.9 % (ref 11.5–15.5)
WBC: 3.7 10*3/uL — AB (ref 4.0–10.5)

## 2016-12-24 LAB — URINALYSIS, ROUTINE W REFLEX MICROSCOPIC
Bilirubin Urine: NEGATIVE
GLUCOSE, UA: NEGATIVE mg/dL
Hgb urine dipstick: NEGATIVE
KETONES UR: NEGATIVE mg/dL
LEUKOCYTES UA: NEGATIVE
NITRITE: NEGATIVE
PH: 8 (ref 5.0–8.0)
Protein, ur: NEGATIVE mg/dL
SPECIFIC GRAVITY, URINE: 1.003 — AB (ref 1.005–1.030)

## 2016-12-24 LAB — BASIC METABOLIC PANEL
Anion gap: 8 (ref 5–15)
BUN: 10 mg/dL (ref 6–20)
CO2: 28 mmol/L (ref 22–32)
Calcium: 9.2 mg/dL (ref 8.9–10.3)
Chloride: 101 mmol/L (ref 101–111)
Creatinine, Ser: 0.84 mg/dL (ref 0.61–1.24)
GFR calc Af Amer: >60 mL/min (ref >60)
Glucose, Bld: 112 mg/dL — ABNORMAL HIGH (ref 65–99)
POTASSIUM: 3.7 mmol/L (ref 3.5–5.1)
SODIUM: 137 mmol/L (ref 135–145)

## 2016-12-24 LAB — CBG MONITORING, ED: Glucose-Capillary: 92 mg/dL (ref 65–99)

## 2016-12-24 NOTE — Discharge Instructions (Signed)
Contact your primary care physician tomorrow to let him know that you were seen in the emergency department today. He may want to see her in the office. Return if concern for any reason

## 2016-12-24 NOTE — ED Notes (Signed)
Over past three days has noticed his handwriting is worse than normal.  Upper extremities feel weaker than usual.  This morning feels bilat legs are also becoming weaker and feels a bit unsteady on feet.

## 2016-12-24 NOTE — ED Triage Notes (Signed)
Pt. Stated, I've had some extremity weakness for about 3 days and my hand writing has not been good. I have broken my back a coupe of times.

## 2016-12-24 NOTE — ED Notes (Signed)
Pt. Taken to MRI.

## 2016-12-24 NOTE — ED Notes (Signed)
Contacted MRI re: delay in scan.  They will be sending for him in 10 min.  Pt aware.

## 2016-12-24 NOTE — ED Notes (Signed)
CBG- 92 

## 2016-12-24 NOTE — ED Notes (Signed)
Patient transported to MRI 

## 2016-12-24 NOTE — ED Notes (Signed)
Off bedpan, minimal results.

## 2016-12-24 NOTE — ED Provider Notes (Signed)
[facility]-EMERGENCY DEPT Provider Note   CSN: [id number] Arrival date & time: 12/24/16  1054     History   Chief Complaint Chief Complaint  Patient presents with  . Extremity Weakness  . Weakness    HPI Pedro Clark is a 83 y.o. male.Patient reports bilateral leg weakness for the past 3 days and difficulty with handwriting for the past 3 days. Symptoms have improved gradually over time. He presently feels at baseline. No treatment prior to coming here. His wife says he's been speaking normally. Patient also reports some focality in organizing his thoughts. No other associated symptoms no fever nothing makes symptoms better or worse however symptoms have improved spontaneously.  HPI  Past Medical History:  Diagnosis Date  . Thyroid disease     There are no active problems to display for this patient.   Past Surgical History:  Procedure Laterality Date  . APPENDECTOMY    . TONSILLECTOMY         Home Medications    Prior to Admission medications   Medication Sig Start Date End Date Taking? Authorizing Provider  aspirin EC 81 MG tablet Take 81 mg by mouth daily.    Historical Provider, MD  Cholecalciferol (VITAMIN D) 2000 UNITS tablet Take 4,000 Units by mouth daily.    Historical Provider, MD  docusate sodium (COLACE) 100 MG capsule Take 1 capsule (100 mg total) by mouth every 12 (twelve) hours. 02/22/15   Eber HongBrian Miller, MD  fish oil-omega-3 fatty acids 1000 MG capsule Take 1 g by mouth daily.    Historical Provider, MD  ibuprofen (ADVIL,MOTRIN) 200 MG tablet Take 200 mg by mouth every 6 (six) hours as needed for mild pain or moderate pain.    Historical Provider, MD  levothyroxine (SYNTHROID, LEVOTHROID) 75 MCG tablet Take 75 mcg by mouth daily.    Historical Provider, MD  naproxen sodium (ANAPROX) 220 MG tablet Take 440 mg by mouth 2 (two) times daily as needed (for pain).    Historical Provider, MD  omeprazole (PRILOSEC) 20 MG capsule Take 20 mg by mouth 2 (two) times  daily.    Historical Provider, MD  polyethylene glycol powder (GLYCOLAX/MIRALAX) powder Take 17 g by mouth 2 (two) times daily. Until daily soft stools  OTC 02/22/15   Eber HongBrian Miller, MD  promethazine (PHENERGAN) 25 MG tablet Take 1 tablet (25 mg total) by mouth every 6 (six) hours as needed for nausea. Patient not taking: Reported on 02/22/2015 02/12/13   Junious SilkHannah Merrell, PA-C  vitamin B-12 (CYANOCOBALAMIN) 1000 MCG tablet Take 1,000 mcg by mouth daily.    Historical Provider, MD  vitamin C (ASCORBIC ACID) 500 MG tablet Take 1,000 mg by mouth daily.    Historical Provider, MD  vitamin E 100 UNIT capsule Take 100 Units by mouth daily.    Historical Provider, MD  Wheat Dextrin (BENEFIBER DRINK MIX PO) Take 2 scoop by mouth daily.    Historical Provider, MD    Family History No family history on file.  Social History Social History  Substance Use Topics  . Smoking status: Never Smoker  . Smokeless tobacco: Not on file  . Alcohol use No     Allergies   Patient has no known allergies.   Review of Systems Review of Systems  Constitutional: Negative.   HENT: Negative.   Respiratory: Negative.   Cardiovascular: Negative.   Gastrointestinal: Negative.   Musculoskeletal: Positive for gait problem.  Skin: Negative.   Neurological:       Difficulty writing  Psychiatric/Behavioral: Negative.   All other systems reviewed and are negative.    Physical Exam Updated Vital Signs BP 104/66 (BP Location: Left Arm)   Pulse 67   Temp 97.4 F (36.3 C) (Oral)   Resp 17   Ht 5' 5.5" (1.664 m)   Wt 133 lb (60.3 kg)   SpO2 100%   BMI 21.80 kg/m   Physical Exam  Constitutional: He is oriented to person, place, and time. He appears well-developed and well-nourished.  HENT:  Head: Normocephalic and atraumatic.  Eyes: Conjunctivae are normal. Pupils are equal, round, and reactive to light.  Neck: Neck supple. No tracheal deviation present. No thyromegaly present.  Cardiovascular: Normal  rate and regular rhythm.   No murmur heard. Pulmonary/Chest: Effort normal and breath sounds normal.  Abdominal: Soft. Bowel sounds are normal. He exhibits no distension. There is no tenderness.  Musculoskeletal: Normal range of motion. He exhibits no edema or tenderness.  Neurological: He is alert and oriented to person, place, and time. Coordination normal.  Motor strength 5 over 5 overall gait normal Romberg normal pronator drift normal finger to nose normal PR symmetric bilaterally at knee jerk ankle jerk and biceps toes downward going bilaterally  Skin: Skin is warm and dry. No rash noted.  Psychiatric: He has a normal mood and affect.  Nursing note and vitals reviewed.    ED Treatments / Results  Labs (all labs ordered are listed, but only abnormal results are displayed) Labs Reviewed  BASIC METABOLIC PANEL - Abnormal; Notable for the following:       Result Value   Glucose, Bld 112 (*)    All other components within normal limits  CBC - Abnormal; Notable for the following:    WBC 3.7 (*)    All other components within normal limits  URINALYSIS, ROUTINE W REFLEX MICROSCOPIC  CBG MONITORING, ED    EKG  EKG Interpretation  Date/Time:  Sunday December 24 2016 11:10:48 EST Ventricular Rate:  69 PR Interval:  194 QRS Duration: 74 QT Interval:  392 QTC Calculation: 420 R Axis:   63 Text Interpretation:  Normal sinus rhythm Possible Left atrial enlargement Borderline ECG No significant change since last tracing Confirmed by Ethelda Chick  MD, Husam Hohn [phone]) on 12/24/2016 11:24:23 AM     Patient signed his name on a piece of paper his wife states that his signature is approximately normal Results for orders placed or performed during the hospital encounter of 12/24/16  Basic metabolic panel  Result Value Ref Range   Sodium 137 135 - 145 mmol/L   Potassium 3.7 3.5 - 5.1 mmol/L   Chloride 101 101 - 111 mmol/L   CO2 28 22 - 32 mmol/L   Glucose, Bld 112 (H) 65 - 99 mg/dL   BUN 10  6 - 20 mg/dL   Creatinine, Ser 6.04 0.61 - 1.24 mg/dL   Calcium 9.2 8.9 - 54.0 mg/dL   GFR calc non Af Amer >60 >60 mL/min   GFR calc Af Amer >60 >60 mL/min   Anion gap 8 5 - 15  CBC  Result Value Ref Range   WBC 3.7 (L) 4.0 - 10.5 K/uL   RBC 4.24 4.22 - 5.81 MIL/uL   Hemoglobin 13.8 13.0 - 17.0 g/dL   HCT 98.1 19.1 - 47.8 %   MCV 96.5 78.0 - 100.0 fL   MCH 32.5 26.0 - 34.0 pg   MCHC 33.7 30.0 - 36.0 g/dL   RDW 29.5 62.1 - 30.8 %  Platelets 152 150 - 400 K/uL  Urinalysis, Routine w reflex microscopic  Result Value Ref Range   Color, Urine STRAW (A) YELLOW   APPearance CLEAR CLEAR   Specific Gravity, Urine 1.003 (L) 1.005 - 1.030   pH 8.0 5.0 - 8.0   Glucose, UA NEGATIVE NEGATIVE mg/dL   Hgb urine dipstick NEGATIVE NEGATIVE   Bilirubin Urine NEGATIVE NEGATIVE   Ketones, ur NEGATIVE NEGATIVE mg/dL   Protein, ur NEGATIVE NEGATIVE mg/dL   Nitrite NEGATIVE NEGATIVE   Leukocytes, UA NEGATIVE NEGATIVE  CBG monitoring, ED  Result Value Ref Range   Glucose-Capillary 92 65 - 99 mg/dL   Mr Brain Wo Contrast  Result Date: 12/24/2016 CLINICAL DATA:  83 year old male with upper extremity weakness, difficulty with hand writing x3 days. Lower extremities now feeling weak. Initial encounter. EXAM: MRI HEAD WITHOUT CONTRAST TECHNIQUE: Multiplanar, multiecho pulse sequences of the brain and surrounding structures were obtained without intravenous contrast. COMPARISON:  Head CT without contrast 02/12/2013. FINDINGS: Brain: Cerebral volume is within normal limits for age. Susceptibility artifact of unclear origin affecting the axial diffusion weighted images, but coronal diffusion weighted images are of good quality. No restricted diffusion to suggest acute infarction. No midline shift, mass effect, evidence of mass lesion, ventriculomegaly, extra-axial collection or acute intracranial hemorrhage. Cervicomedullary junction and pituitary are within normal limits. No cortical encephalomalacia. No  chronic cerebral blood products identified. Mild for age nonspecific mostly periventricular cerebral white matter T2 and FLAIR hyperintensity. Deep gray matter nuclei, brainstem and cerebellum appear normal. Vascular: Major intracranial vascular flow voids are preserved. Skull and upper cervical spine: Negative; degenerative ligamentous hypertrophy about the odontoid. Normal bone marrow signal. Sinuses/Orbits: Postoperative changes to both globes, otherwise negative orbit soft tissues. Occasional trace paranasal sinus mucosal thickening. Paranasal sinuses and mastoids are otherwise clear. Other: Visible internal auditory structures appear normal. Negative scalp soft tissues. IMPRESSION: 1.  No acute intracranial abnormality. 2. Largely unremarkable for age noncontrast MRI appearance of the brain; mild nonspecific white matter changes. Electronically Signed   By: Odessa Fleming M.D.   On: 12/24/2016 16:09   Radiology No results found.  Procedures Procedures (including critical care time)  Medications Ordered in ED Medications - No data to display   Initial Impression / Assessment and Plan / ED Course  I have reviewed the triage vital signs and the nursing notes.  Pertinent labs & imaging results that were available during my care of the patient were reviewed by me and considered in my medical decision making (see chart for details).     Patient asymptomatic at 5:20 PM. He is alert and appropriate. Plan follow-up with PMD. By definition patient's symptoms are not consistent with TIA in that there were 3 days long and resolved.  Final Clinical Impressions(s) / ED Diagnoses  Dx weakness Final diagnoses:  None    New Prescriptions New Prescriptions   No medications on file     Doug Sou, MD 12/24/16 1720

## 2017-10-16 IMAGING — MR MR HEAD W/O CM
9 of 11 series · 32 of 48 positions shown · non-contrast
Comparison: Head CT without contrast 02/12/2013.

CLINICAL DATA: 84-year-old male with upper extremity weakness,
difficulty with hand writing x3 days. Lower extremities now feeling
weak. Initial encounter.

EXAM:
MRI HEAD WITHOUT CONTRAST
TECHNIQUE: Multiplanar, multiecho pulse sequences of the brain and surrounding
structures were obtained without intravenous contrast.

[Series 3: DWI · axial · 3.6mm · 0.94mm/px · z∈[-81,+95]mm · 7 of 100 slices shown (1 of 2)]
[im 1/100]
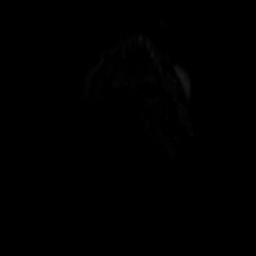
[im 17/100]
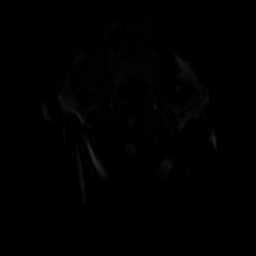
[im 34/100]
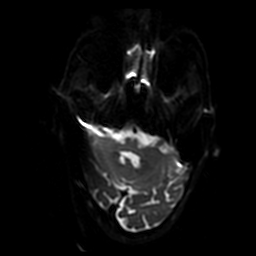
[im 50/100]
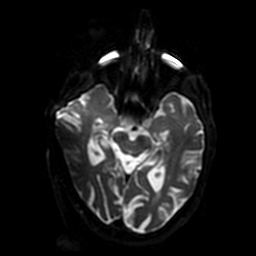
[im 67/100]
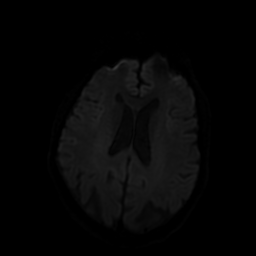
[im 83/100]
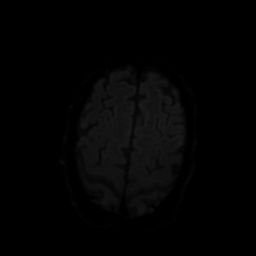
[im 100/100]
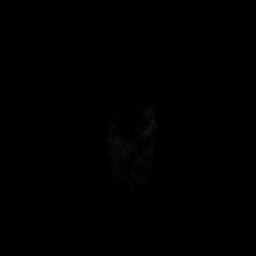

[Series 5: FLAIR · sagittal · 5.0mm · 0.47mm/px · 2 of 26 slices shown (1 of 2)]
[im 1/26]
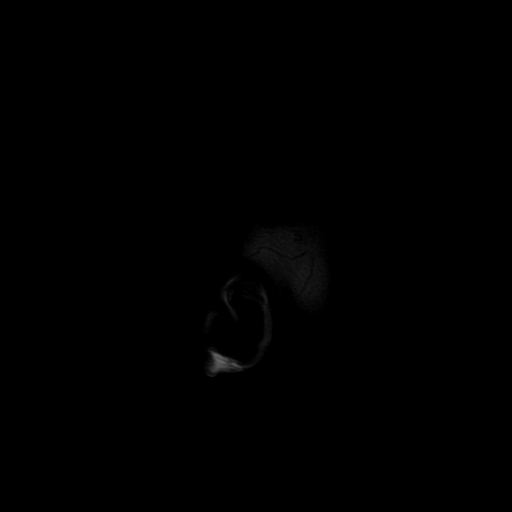
[im 26/26]
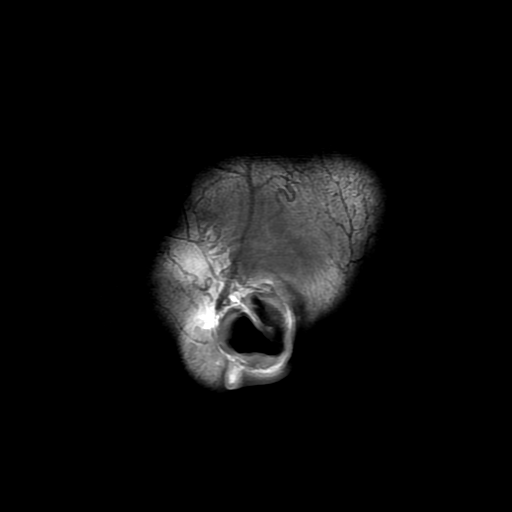

[Series 6: T2 · axial · 5.0mm · 0.47mm/px · z∈[-80,+94]mm · 2 of 29 slices shown (1 of 2)]
[im 1/29]
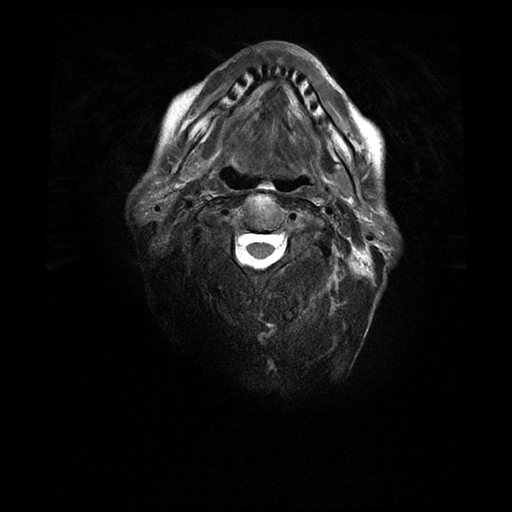
[im 29/29]
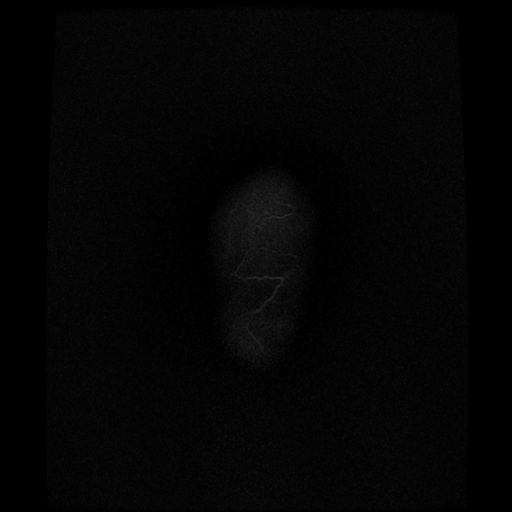

[Series 7: FLAIR · axial · 5.0mm · 0.47mm/px · z∈[-80,+94]mm · 2 of 30 slices shown (2 of 2)]
[im 1/30]
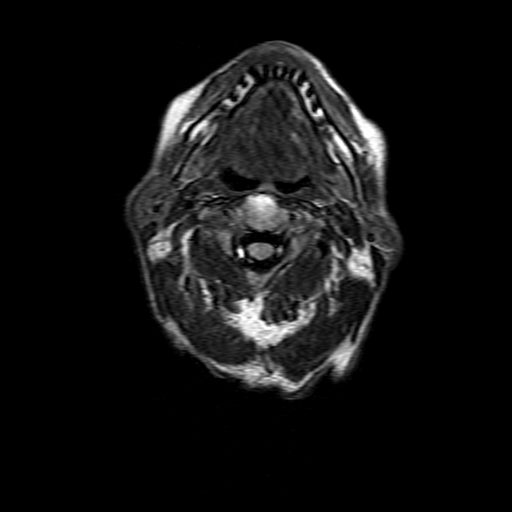
[im 30/30]
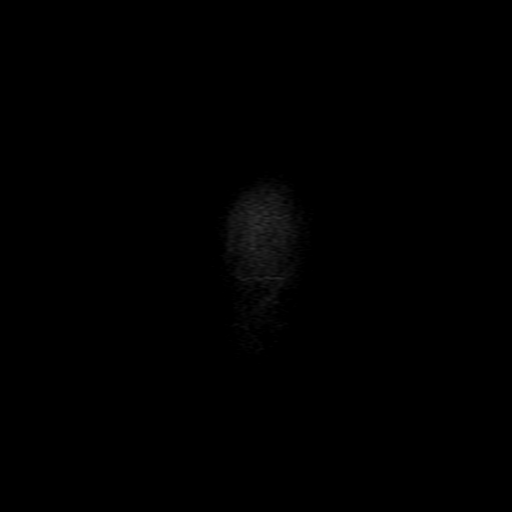

[Series 16: (person_name) · axial · 3.0mm · 0.47mm/px · z∈[-120,+9]mm · 6 of 116 slices shown]
[im 1/116]
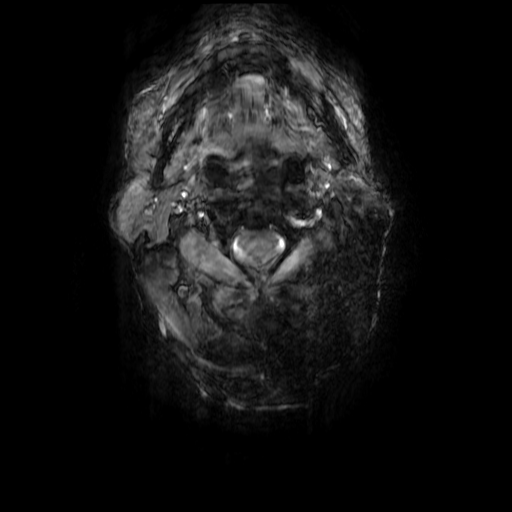
[im 15/116]
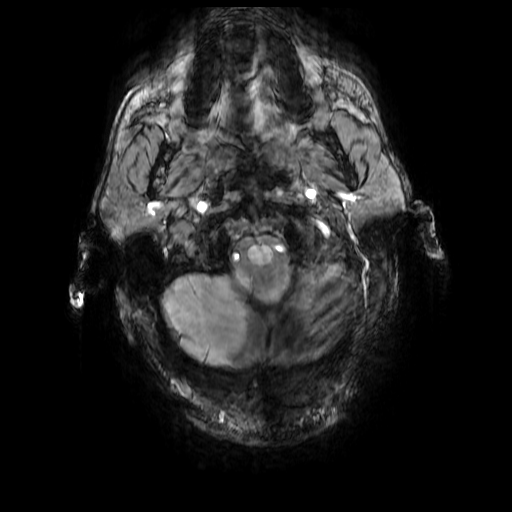
[im 29/116]
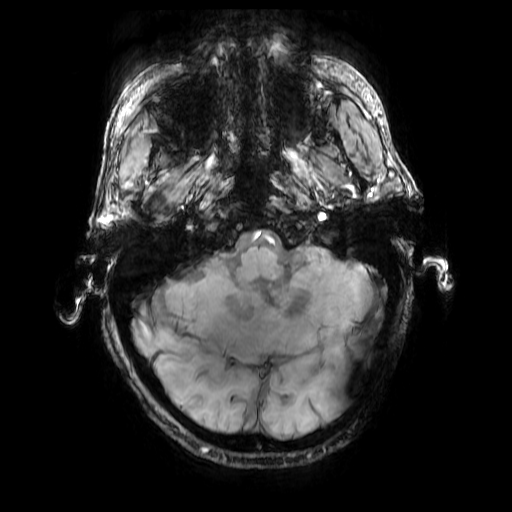
[im 44/116]
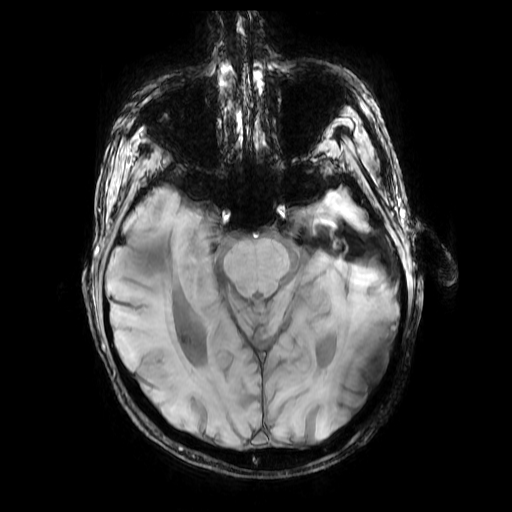
[im 72/116]
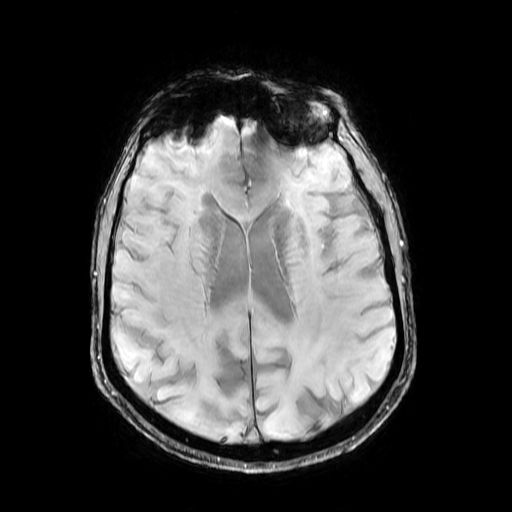
[im 87/116]
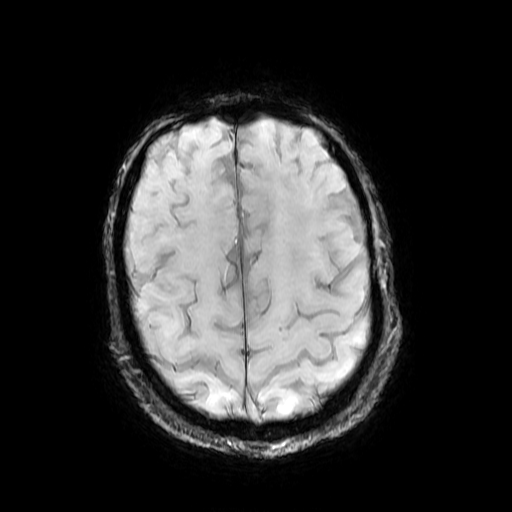

[Series 17: T2 · coronal · 5.0mm · 0.47mm/px · 1 of 16 slices shown (2 of 2)]
[im 1/16]
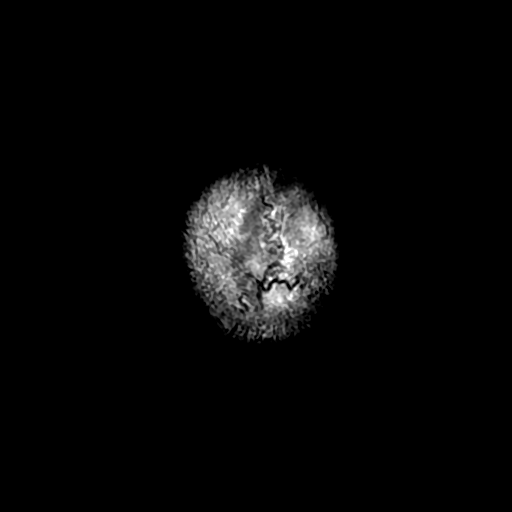

[Series 21: DWI · coronal · 4.0mm · 0.94mm/px · 5 of 72 slices shown (2 of 2)]
[im 1/72]
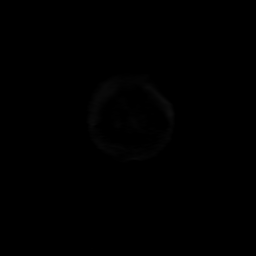
[im 18/72]
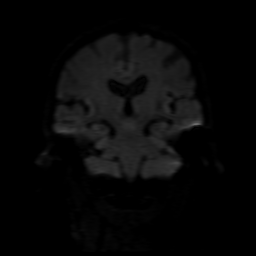
[im 36/72]
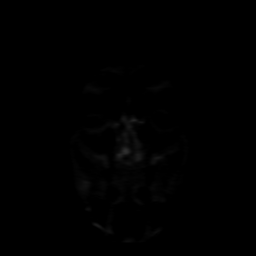
[im 54/72]
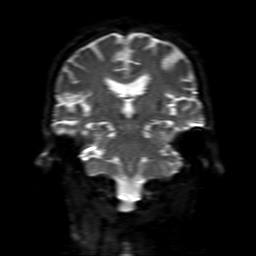
[im 72/72]
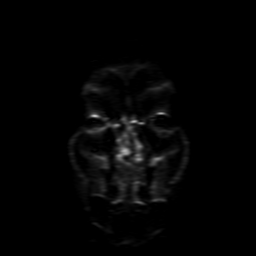

[Series 350: ADC · axial · 3.6mm · 0.94mm/px · z∈[-81,+95]mm · 4 of 50 slices shown (1 of 2)]
[im 1/50]
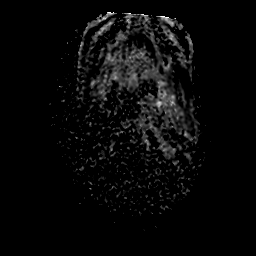
[im 17/50]
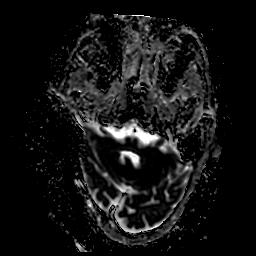
[im 33/50]
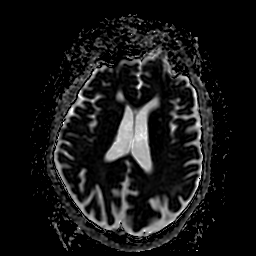
[im 50/50]
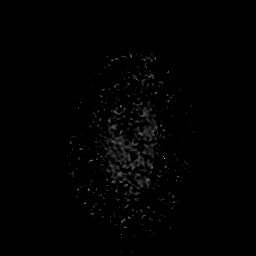

[Series 2150: ADC · coronal · 4.0mm · 0.94mm/px · 3 of 36 slices shown (2 of 2)]
[im 1/36]
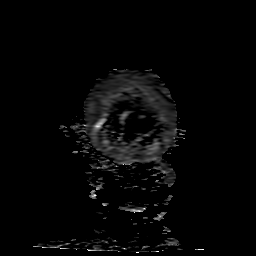
[im 18/36]
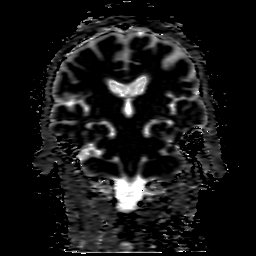
[im 36/36]
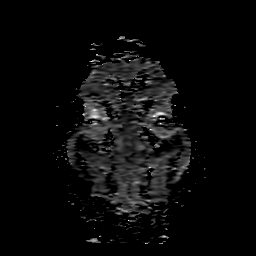

[32 of 48 positions shown; findings below may reference images not displayed]

FINDINGS: Brain: Cerebral volume is within normal limits for age.
Susceptibility artifact of unclear origin affecting the axial
diffusion weighted images, but coronal diffusion weighted images are
of good quality. No restricted diffusion to suggest acute
infarction. No midline shift, mass effect, evidence of mass lesion,
ventriculomegaly, extra-axial collection or acute intracranial
hemorrhage. Cervicomedullary junction and pituitary are within
normal limits. No cortical encephalomalacia. No chronic cerebral
blood products identified. Mild for age nonspecific mostly
periventricular cerebral white matter T2 and FLAIR hyperintensity.
Deep gray matter nuclei, brainstem and cerebellum appear normal.

Vascular: Major intracranial vascular flow voids are preserved.

Skull and upper cervical spine: Negative; degenerative ligamentous
hypertrophy about the odontoid. Normal bone marrow signal.

Sinuses/Orbits: Postoperative changes to both globes, otherwise
negative orbit soft tissues. Occasional trace paranasal sinus
mucosal thickening. Paranasal sinuses and mastoids are otherwise
clear.

Other: Visible internal auditory structures appear normal. Negative
scalp soft tissues.
IMPRESSION: 1.  No acute intracranial abnormality.
2. Largely unremarkable for age noncontrast MRI appearance of the
brain; mild nonspecific white matter changes.

## 2019-07-10 ENCOUNTER — Emergency Department (HOSPITAL_COMMUNITY)
Admission: EM | Admit: 2019-07-10 | Discharge: 2019-07-10 | Disposition: A | Discharge status: Home / Self Care | Payer: Medicare Other | Attending: Emergency Medicine | Admitting: Emergency Medicine

## 2019-07-10 ENCOUNTER — Encounter (HOSPITAL_COMMUNITY): Payer: Self-pay | Admitting: *Deleted

## 2019-07-10 ENCOUNTER — Other Ambulatory Visit: Payer: Self-pay

## 2019-07-10 DIAGNOSIS — E079 Disorder of thyroid, unspecified: Secondary | ICD-10-CM | POA: Insufficient documentation

## 2019-07-10 DIAGNOSIS — R1011 Right upper quadrant pain: Secondary | ICD-10-CM | POA: Insufficient documentation

## 2019-07-10 DIAGNOSIS — Z79899 Other long term (current) drug therapy: Secondary | ICD-10-CM | POA: Diagnosis not present

## 2019-07-10 DIAGNOSIS — M546 Pain in thoracic spine: Secondary | ICD-10-CM | POA: Insufficient documentation

## 2019-07-10 DIAGNOSIS — Z7982 Long term (current) use of aspirin: Secondary | ICD-10-CM | POA: Insufficient documentation

## 2019-07-10 LAB — URINALYSIS, ROUTINE W REFLEX MICROSCOPIC
Bilirubin Urine: NEGATIVE
Glucose, UA: NEGATIVE mg/dL
Hgb urine dipstick: NEGATIVE
Ketones, ur: NEGATIVE mg/dL
Leukocytes,Ua: NEGATIVE
Nitrite: NEGATIVE
Protein, ur: NEGATIVE mg/dL
Specific Gravity, Urine: 1.003 — ABNORMAL LOW (ref 1.005–1.030)
pH: 7 (ref 5.0–8.0)

## 2019-07-10 LAB — COMPREHENSIVE METABOLIC PANEL
ALT: 14 U/L (ref 0–44)
AST: 22 U/L (ref 15–41)
Albumin: 4.1 g/dL (ref 3.5–5.0)
Alkaline Phosphatase: 60 U/L (ref 38–126)
Anion gap: 10 (ref 5–15)
BUN: 11 mg/dL (ref 8–23)
CO2: 26 mmol/L (ref 22–32)
Calcium: 9.8 mg/dL (ref 8.9–10.3)
Chloride: 101 mmol/L (ref 98–111)
Creatinine, Ser: 0.92 mg/dL (ref 0.61–1.24)
GFR calc Af Amer: >60 mL/min (ref >60)
GFR calc non Af Amer: >60 mL/min (ref >60)
Glucose, Bld: 100 mg/dL — ABNORMAL HIGH (ref 70–99)
Potassium: 4.4 mmol/L (ref 3.5–5.1)
Sodium: 137 mmol/L (ref 135–145)
Total Bilirubin: 0.5 mg/dL (ref 0.3–1.2)
Total Protein: 7.3 g/dL (ref 6.5–8.1)

## 2019-07-10 LAB — CBC
HCT: 40.7 % (ref 39.0–52.0)
Hemoglobin: 13.5 g/dL (ref 13.0–17.0)
MCH: 32.7 pg (ref 26.0–34.0)
MCHC: 33.2 g/dL (ref 30.0–36.0)
MCV: 98.5 fL (ref 80.0–100.0)
Platelets: 180 10*3/uL (ref 150–400)
RBC: 4.13 MIL/uL — ABNORMAL LOW (ref 4.22–5.81)
RDW: 12.5 % (ref 11.5–15.5)
WBC: 4.8 10*3/uL (ref 4.0–10.5)
nRBC: 0 % (ref 0.0–0.2)

## 2019-07-10 LAB — LIPASE, BLOOD: Lipase: 32 U/L (ref 11–51)

## 2019-07-10 MED ORDER — FAMOTIDINE 20 MG PO TABS: 20.0000 mg | ORAL_TABLET | Freq: Two times a day (BID) | ORAL | 0 refills | Status: AC

## 2019-07-10 MED ORDER — SODIUM CHLORIDE 0.9% FLUSH: 3.0000 mL | Freq: Once | INTRAVENOUS | Status: DC

## 2019-07-10 MED ORDER — ACETAMINOPHEN 500 MG PO TABS: 1000.0000 mg | ORAL_TABLET | Freq: Four times a day (QID) | ORAL | 0 refills | Status: AC | PRN

## 2019-07-10 NOTE — ED Provider Notes (Signed)
[facility] Provider Note   CSN: [id number] Arrival date & time: 07/10/19  1312     History   Chief Complaint Chief Complaint  Patient presents with  . Abdominal Pain  . Back Pain    HPI Pedro Clark is a 85 y.o. male.     HPI Patient presents for 2 different pain complaints.  He has pain in the left upper thoracic back close to the shoulder blade.  This is started 2 weeks ago.  He reports that due to osteoporosis and multiple spine fractures, he has become fairly hunched back.  He was trying to really straighten out and pushing back against the wall to stretch and straighten his back.  He did get a sudden pain in the upper left back and thought he might of caused another fracture.  It is actually getting better at this time.  He has been taking ibuprofen which gives him relief.  No associated fever, cough or shortness of breath.  He reports he has a secondary of pain whereupon he indicates his right mid and upper abdomen.  He reports that this often seems to bother him when he actually has a "fracture in his back".  He however admits that this is been fairly longstanding and he has discussed it with his doctor but no testing has been done.  He reports that it does get worse when he eats.  It particularly gets worse with his breakfast which is shredded wheat and whole milk.  He reports after a few hours the pain subsides.  He does not have vomiting or diarrhea.  No fever no chills.  No urinary symptoms.  Patient still has his gallbladder.  He has history of appendectomy. Past Medical History:  Diagnosis Date  . Thyroid disease     There are no active problems to display for this patient.   Past Surgical History:  Procedure Laterality Date  . APPENDECTOMY    . TONSILLECTOMY          Home Medications    Prior to Admission medications   Medication Sig Start Date End Date Taking? Authorizing Provider  acetaminophen (TYLENOL) 500 MG  tablet Take 2 tablets (1,000 mg total) by mouth every 6 (six) hours as needed. 07/10/19   Charlesetta Shanks, MD  aspirin EC 81 MG tablet Take 81 mg by mouth daily.    [provider]  Cholecalciferol (VITAMIN D) 2000 UNITS tablet Take 4,000 Units by mouth daily.    [provider]  docusate sodium (COLACE) 100 MG capsule Take 1 capsule (100 mg total) by mouth every 12 (twelve) hours. 02/22/15   Noemi Chapel, MD  famotidine (PEPCID) 20 MG tablet Take 1 tablet (20 mg total) by mouth 2 (two) times daily. 07/10/19   Charlesetta Shanks, MD  fish oil-omega-3 fatty acids 1000 MG capsule Take 1 g by mouth daily.    [provider]  ibuprofen (ADVIL,MOTRIN) 200 MG tablet Take 200 mg by mouth every 6 (six) hours as needed for mild pain or moderate pain.    [provider]  levothyroxine (SYNTHROID, LEVOTHROID) 75 MCG tablet Take 75 mcg by mouth daily.    [provider]  naproxen sodium (ANAPROX) 220 MG tablet Take 440 mg by mouth 2 (two) times daily as needed (for pain).    [provider]  omeprazole (PRILOSEC) 20 MG capsule Take 20 mg by mouth 2 (two) times daily.    [provider]  polyethylene glycol powder (  GLYCOLAX/MIRALAX) powder Take 17 g by mouth 2 (two) times daily. Until daily soft stools  OTC 02/22/15   Eber Hong, MD  promethazine (PHENERGAN) 25 MG tablet Take 1 tablet (25 mg total) by mouth every 6 (six) hours as needed for nausea. Patient not taking: Reported on 02/22/2015 02/12/13   Junious Silk, PA-C  vitamin B-12 (CYANOCOBALAMIN) 1000 MCG tablet Take 1,000 mcg by mouth daily.    [provider]  vitamin C (ASCORBIC ACID) 500 MG tablet Take 1,000 mg by mouth daily.    [provider]  vitamin E 100 UNIT capsule Take 100 Units by mouth daily.    [provider]  Wheat Dextrin (BENEFIBER DRINK MIX PO) Take 2 scoop by mouth daily.    [provider]    Family History History reviewed. No pertinent  family history.  Social History Social History   Tobacco Use  . Smoking status: Never Smoker  Substance Use Topics  . Alcohol use: No  . Drug use: No     Allergies   Patient has no known allergies.   Review of Systems Review of Systems 10 Systems reviewed and are negative for acute change except as noted in the HPI.   Physical Exam Updated Vital Signs BP 132/68   Pulse 71   Temp 98.2 F (36.8 C) (Oral)   Resp 16   SpO2 99%   Physical Exam Constitutional:      Comments: Patient is well in appearance.  Alert and nontoxic.  No respiratory distress.  Mental status clear.  HENT:     Head: Normocephalic and atraumatic.  Eyes:     Extraocular Movements: Extraocular movements intact.     Conjunctiva/sclera: Conjunctivae normal.  Neck:     Musculoskeletal: Neck supple.  Cardiovascular:     Rate and Rhythm: Normal rate and regular rhythm.  Pulmonary:     Effort: Pulmonary effort is normal.     Breath sounds: Normal breath sounds.  Abdominal:     Palpations: Abdomen is soft.     Tenderness: There is abdominal tenderness.     Comments: Patient has focal point of tenderness in the right upper quadrant.  He agrees this is where the pain seems to come from.  No guarding or rebound.  Lower abdomen nontender.  Musculoskeletal:     Comments: Patient has focus of reproducible pain on the thoracic left back at approximately the fifth rib adjacent to the medial aspect of the scapula.  Pain is very focal and very reproducible.  No crepitus.  No rashes.  No peripheral edema of the lower extremities.  Calves are soft and nontender.  Neurological:     General: No focal deficit present.     Mental Status: He is oriented to person, place, and time.     Coordination: Coordination normal.  Psychiatric:        Mood and Affect: Mood normal.      ED Treatments / Results  Labs (all labs ordered are listed, but only abnormal results are displayed) Labs Reviewed  COMPREHENSIVE  METABOLIC PANEL - Abnormal; Notable for the following components:      Result Value   Glucose, Bld 100 (*)    All other components within normal limits  CBC - Abnormal; Notable for the following components:   RBC 4.13 (*)    All other components within normal limits  URINALYSIS, ROUTINE W REFLEX MICROSCOPIC - Abnormal; Notable for the following components:   Color, Urine STRAW (*)  Specific Gravity, Urine 1.003 (*)    All other components within normal limits  LIPASE, BLOOD    EKG None  Radiology No results found.  Procedures Procedures (including critical care time)  Medications Ordered in ED Medications  sodium chloride flush (NS) 0.9 % injection 3 mL (3 mLs Intravenous Not Given 07/10/19 1645)     Initial Impression / Assessment and Plan / ED Course  I have reviewed the triage vital signs and the nursing notes.  Pertinent labs & imaging results that were available during my care of the patient were reviewed by me and considered in my medical decision making (see chart for details).       Patient is alert and well.  His thoracic pain is improving.  He had very specific onset while trying to stretch 2 weeks ago.  Breath sounds are clear no respiratory distress.  I do not have suspicion for pulmonary complications. Regarding secondary complaint of abdominal pain, this has features very suggestive of biliary colic.  It is precipitated by eating and resolves after a few hours.  Particularly worse with fatty foods.  LFTs and white count are normal.  At this time I feel patient is stable for outpatient ultrasound and follow-up.  This has been going on for quite some time greater than several months.  Patient has been counseled on a plan for dietary modification, changing from NSAIDs to Tylenol for possible associated gastritis, follow-up ultrasound and follow-up with PCP.  Final Clinical Impressions(s) / ED Diagnoses   Final diagnoses:  Acute left-sided thoracic back pain   Right upper quadrant abdominal pain    ED Discharge Orders         Ordered    famotidine (PEPCID) 20 MG tablet  2 times daily     07/10/19 1643    acetaminophen (TYLENOL) 500 MG tablet  Every 6 hours PRN     07/10/19 1643    US ABDOMEN LIMITED RUQ     07/10/19 1645           Arby BarrettePfeiffer, Cristan Scherzer, MD 07/10/19 1700

## 2019-07-10 NOTE — Discharge Instructions (Signed)
1.  The pain in your upper back is likely due to a broken rib from stretching.  Try applying warm, moist heat if it feels better.  Take Tylenol (acetaminophen) for pain as needed.  Try to avoid ibuprofen products or other products called NSAIDs because they may worsen your abdominal pain. 2.  I suspect your abdominal pain is due to your gallbladder.  Your laboratory work done in the emergency department is normal at this time.  You need a follow-up ultrasound on the gallbladder.  This has been ordered on an outpatient basis. 3.  It is very important that you have a very low-fat to no fat diet.  Fats stimulate the gallbladder and will make it more painful.  See the attached instructions regarding diet.  Keep in mind that most dairy products are high in fat and less they are specifically marked low-fat or no fat. 4.  Follow-up with your doctor to review results from your ultrasound and further referrals if needed for your abdominal pain. 5.  Return to the emergency department if you are developing a fever, vomiting, worsening pain or other concerning symptoms.

## 2019-07-10 NOTE — ED Triage Notes (Signed)
Pt reports mid back pain for several days. Hx of back fractures and thinks he has possibly injured it again. Now has mid abd pain after eating. Denies n/v/d.

## 2019-07-15 ENCOUNTER — Other Ambulatory Visit (HOSPITAL_COMMUNITY): Payer: Self-pay | Admitting: Internal Medicine

## 2019-07-15 ENCOUNTER — Other Ambulatory Visit: Payer: Self-pay | Admitting: Internal Medicine

## 2019-07-15 DIAGNOSIS — R1011 Right upper quadrant pain: Secondary | ICD-10-CM

## 2019-07-18 ENCOUNTER — Ambulatory Visit (HOSPITAL_COMMUNITY): Payer: Medicare Other

## 2019-07-18 ENCOUNTER — Encounter (HOSPITAL_COMMUNITY): Payer: Self-pay

## 2019-09-11 ENCOUNTER — Emergency Department (HOSPITAL_COMMUNITY)
Admission: EM | Admit: 2019-09-11 | Discharge: 2019-09-12 | Disposition: A | Discharge status: Home / Self Care | Payer: Medicare Other | Attending: Emergency Medicine | Admitting: Emergency Medicine

## 2019-09-11 ENCOUNTER — Other Ambulatory Visit: Payer: Self-pay

## 2019-09-11 ENCOUNTER — Encounter (HOSPITAL_COMMUNITY): Payer: Self-pay | Admitting: Emergency Medicine

## 2019-09-11 ENCOUNTER — Emergency Department (HOSPITAL_COMMUNITY): Payer: Medicare Other

## 2019-09-11 DIAGNOSIS — E079 Disorder of thyroid, unspecified: Secondary | ICD-10-CM | POA: Diagnosis not present

## 2019-09-11 DIAGNOSIS — Y939 Activity, unspecified: Secondary | ICD-10-CM | POA: Diagnosis not present

## 2019-09-11 DIAGNOSIS — S22000A Wedge compression fracture of unspecified thoracic vertebra, initial encounter for closed fracture: Secondary | ICD-10-CM | POA: Diagnosis not present

## 2019-09-11 DIAGNOSIS — Y999 Unspecified external cause status: Secondary | ICD-10-CM | POA: Diagnosis not present

## 2019-09-11 DIAGNOSIS — Z7982 Long term (current) use of aspirin: Secondary | ICD-10-CM | POA: Diagnosis not present

## 2019-09-11 DIAGNOSIS — K5900 Constipation, unspecified: Secondary | ICD-10-CM | POA: Diagnosis not present

## 2019-09-11 DIAGNOSIS — Y929 Unspecified place or not applicable: Secondary | ICD-10-CM | POA: Diagnosis not present

## 2019-09-11 DIAGNOSIS — R1011 Right upper quadrant pain: Secondary | ICD-10-CM | POA: Diagnosis not present

## 2019-09-11 DIAGNOSIS — Z79899 Other long term (current) drug therapy: Secondary | ICD-10-CM | POA: Diagnosis not present

## 2019-09-11 DIAGNOSIS — X58XXXA Exposure to other specified factors, initial encounter: Secondary | ICD-10-CM | POA: Diagnosis not present

## 2019-09-11 DIAGNOSIS — S32050A Wedge compression fracture of fifth lumbar vertebra, initial encounter for closed fracture: Secondary | ICD-10-CM | POA: Insufficient documentation

## 2019-09-11 DIAGNOSIS — M546 Pain in thoracic spine: Secondary | ICD-10-CM | POA: Diagnosis present

## 2019-09-11 LAB — URINALYSIS, ROUTINE W REFLEX MICROSCOPIC
Bilirubin Urine: NEGATIVE
Glucose, UA: NEGATIVE mg/dL
Hgb urine dipstick: NEGATIVE
Ketones, ur: NEGATIVE mg/dL
Leukocytes,Ua: NEGATIVE
Nitrite: NEGATIVE
Protein, ur: NEGATIVE mg/dL
Specific Gravity, Urine: 1.003 — ABNORMAL LOW (ref 1.005–1.030)
pH: 7 (ref 5.0–8.0)

## 2019-09-11 LAB — COMPREHENSIVE METABOLIC PANEL
ALT: 16 U/L (ref 0–44)
AST: 23 U/L (ref 15–41)
Albumin: 3.8 g/dL (ref 3.5–5.0)
Alkaline Phosphatase: 71 U/L (ref 38–126)
Anion gap: 8 (ref 5–15)
BUN: 12 mg/dL (ref 8–23)
CO2: 26 mmol/L (ref 22–32)
Calcium: 9.5 mg/dL (ref 8.9–10.3)
Chloride: 101 mmol/L (ref 98–111)
Creatinine, Ser: 1 mg/dL (ref 0.61–1.24)
GFR calc Af Amer: >60 mL/min (ref >60)
GFR calc non Af Amer: >60 mL/min (ref >60)
Glucose, Bld: 110 mg/dL — ABNORMAL HIGH (ref 70–99)
Potassium: 4.2 mmol/L (ref 3.5–5.1)
Sodium: 135 mmol/L (ref 135–145)
Total Bilirubin: 0.7 mg/dL (ref 0.3–1.2)
Total Protein: 6.6 g/dL (ref 6.5–8.1)

## 2019-09-11 LAB — CBC
HCT: 37.1 % — ABNORMAL LOW (ref 39.0–52.0)
Hemoglobin: 12.4 g/dL — ABNORMAL LOW (ref 13.0–17.0)
MCH: 32.9 pg (ref 26.0–34.0)
MCHC: 33.4 g/dL (ref 30.0–36.0)
MCV: 98.4 fL (ref 80.0–100.0)
Platelets: 161 10*3/uL (ref 150–400)
RBC: 3.77 MIL/uL — ABNORMAL LOW (ref 4.22–5.81)
RDW: 12.5 % (ref 11.5–15.5)
WBC: 4.1 10*3/uL (ref 4.0–10.5)
nRBC: 0 % (ref 0.0–0.2)

## 2019-09-11 LAB — LIPASE, BLOOD: Lipase: 28 U/L (ref 11–51)

## 2019-09-11 MED ORDER — SODIUM CHLORIDE 0.9% FLUSH
3.0000 mL | Freq: Once | INTRAVENOUS | Status: AC
  Administered 2019-09-11: 3 mL via INTRAVENOUS

## 2019-09-11 MED ORDER — IOHEXOL 300 MG/ML  SOLN
100.0000 mL | Freq: Once | INTRAMUSCULAR | Status: AC | PRN
  Administered 2019-09-11: 21:00:00 100 mL via INTRAVENOUS

## 2019-09-11 NOTE — ED Notes (Signed)
Pt and pt's wife approached this tech really upset about the wait time and that I can't give them a definite answer. Pt is upset that we've kept his wife waiting in the care because "she is a very sick woman and we're treating her like a dog." Pt asked to speak with Charge RN, Deneise Lever was notified.

## 2019-09-11 NOTE — ED Provider Notes (Signed)
[facility] EMERGENCY DEPARTMENT Provider Note   CSN: [id number] Arrival date & time: 09/11/19  1315     History   Chief Complaint Chief Complaint  Patient presents with   Back Pain   Abdominal Pain    HPI Pedro Clark is a 85 y.o. male.     HPI Patient presents to the emergency room for evaluation of upper abdominal pain.  Patient states he is having pain in his right upper abdomen and back for the last couple of months.  Patient has noticed that certain movements and positions can often bring on the pain.  Sometimes it does get worse with eating.  The pain can be very severe at times and almost brings him to his knees.  He denies any recent falls or injuries but did have one a while back.  Denies any fevers or chills.  No vomiting or diarrhea.  Patient states he was evaluated in the past with a gallbladder ultrasound.  He states he saw GI doctor who discussed doing additional testing such as endoscopy and MRI.  Patient has not followed up on those test.  Symptoms seem to be intensifying so he drove down from [place] to come get evaluated by [place] at this hospital. Past Medical History:  Diagnosis Date   Thyroid disease     There are no active problems to display for this patient.   Past Surgical History:  Procedure Laterality Date   APPENDECTOMY     TONSILLECTOMY          Home Medications    Prior to Admission medications   Medication Sig Start Date End Date Taking? Authorizing Provider  acetaminophen (TYLENOL) 500 MG tablet Take 2 tablets (1,000 mg total) by mouth every 6 (six) hours as needed. Patient taking differently: Take 500 mg by mouth See admin instructions. Take 500 mg by mouth in the morning and an additional 500 mg two to three times a day as needed for pain 07/10/19  Yes Pfeiffer, Jeannie Done, MD  aspirin EC 81 MG tablet Take 81 mg by mouth daily.   Yes [provider]  Cholecalciferol (VITAMIN D3) 50 MCG (2000 UT) TABS Take 4,000  Units by mouth daily with breakfast.   Yes [provider]  fish oil-omega-3 fatty acids 1000 MG capsule Take 1 g by mouth daily.   Yes [provider]  levothyroxine (SYNTHROID, LEVOTHROID) 75 MCG tablet Take 75 mcg by mouth daily before breakfast.    Yes [provider]  psyllium (METAMUCIL) 58.6 % powder See admin instructions. Mix 2 teaspoonsful of powder into 6-8 ounces of juice and drink once a day   Yes [provider]  vitamin B-12 (CYANOCOBALAMIN) 1000 MCG tablet Take 1,000 mcg by mouth daily.   Yes [provider]  vitamin C (ASCORBIC ACID) 500 MG tablet Take 1,000 mg by mouth daily after breakfast. 07/14/02  Yes [provider]  vitamin E 100 UNIT capsule Take 100 Units by mouth daily.   Yes [provider]  docusate sodium (COLACE) 100 MG capsule Take 1 capsule (100 mg total) by mouth every 12 (twelve) hours. Patient not taking: Reported on 09/11/2019 02/22/15   Noemi Chapel, MD  famotidine (PEPCID) 20 MG tablet Take 1 tablet (20 mg total) by mouth 2 (two) times daily. Patient not taking: Reported on 09/11/2019 07/10/19   Charlesetta Shanks, MD  polyethylene glycol powder (GLYCOLAX/MIRALAX) powder Take 17 g by mouth 2 (two) times daily. Until daily soft stools  OTC  Patient not taking: Reported on 09/11/2019 02/22/15   Eber Hong, MD  promethazine (PHENERGAN) 25 MG tablet Take 1 tablet (25 mg total) by mouth every 6 (six) hours as needed for nausea. Patient not taking: Reported on 09/11/2019 02/12/13   Junious Silk, PA-C    Family History No family history on file.  Social History Social History   Tobacco Use   Smoking status: Never Smoker  Substance Use Topics   Alcohol use: No   Drug use: No     Allergies   [place] grass extract and Dust mite extract   Review of Systems Review of Systems  All other systems reviewed and are negative.    Physical Exam Updated Vital Signs BP 127/72    Pulse 78    Temp  97.6 F (36.4 C) (Oral)    Resp 16    SpO2 99%   Physical Exam Vitals signs and nursing note reviewed.  Constitutional:      General: He is not in acute distress.    Appearance: He is well-developed.  HENT:     Head: Normocephalic and atraumatic.     Right Ear: External ear normal.     Left Ear: External ear normal.  Eyes:     General: No scleral icterus.       Right eye: No discharge.        Left eye: No discharge.     Conjunctiva/sclera: Conjunctivae normal.  Neck:     Musculoskeletal: Neck supple.     Trachea: No tracheal deviation.  Cardiovascular:     Rate and Rhythm: Normal rate and regular rhythm.  Pulmonary:     Effort: Pulmonary effort is normal. No respiratory distress.     Breath sounds: Normal breath sounds. No stridor. No wheezing or rales.  Abdominal:     General: Bowel sounds are normal. There is no distension.     Palpations: Abdomen is soft.     Tenderness: There is abdominal tenderness in the right upper quadrant. There is no guarding or rebound.  Musculoskeletal:        General: No tenderness.  Skin:    General: Skin is warm and dry.     Findings: No rash.  Neurological:     Mental Status: He is alert.     Cranial Nerves: No cranial nerve deficit (no facial droop, extraocular movements intact, no slurred speech).     Sensory: No sensory deficit.     Motor: No abnormal muscle tone or seizure activity.     Coordination: Coordination normal.      ED Treatments / Results  Labs (all labs ordered are listed, but only abnormal results are displayed) Labs Reviewed  COMPREHENSIVE METABOLIC PANEL - Abnormal; Notable for the following components:      Result Value   Glucose, Bld 110 (*)    All other components within normal limits  CBC - Abnormal; Notable for the following components:   RBC 3.77 (*)    Hemoglobin 12.4 (*)    HCT 37.1 (*)    All other components within normal limits  URINALYSIS, ROUTINE W REFLEX MICROSCOPIC - Abnormal; Notable for the  following components:   Color, Urine STRAW (*)    Specific Gravity, Urine 1.003 (*)    All other components within normal limits  LIPASE, BLOOD    EKG None  Radiology Ct Abdomen Pelvis W Contrast  Result Date: 09/11/2019 CLINICAL DATA:  Abdominal pain EXAM: CT ABDOMEN AND PELVIS WITH CONTRAST TECHNIQUE: Multidetector CT  imaging of the abdomen and pelvis was performed using the standard protocol following bolus administration of intravenous contrast. CONTRAST:  100mL OMNIPAQUE IOHEXOL 300 MG/ML  SOLN COMPARISON:  02/22/2015 FINDINGS: Lower chest: Calcified granuloma at the left base. No acute abnormality. Hepatobiliary: Scattered small hypodensities in the liver are stable, likely small cysts. Gallbladder unremarkable. Pancreas: No focal abnormality or ductal dilatation. Spleen: No focal abnormality.  Normal size. Adrenals/Urinary Tract: No adrenal abnormality. No focal renal abnormality. No stones or hydronephrosis. Urinary bladder is unremarkable. Stomach/Bowel: Large stool burden throughout the colon with gaseous distention of the colon. No evidence of bowel obstruction. Vascular/Lymphatic: Aortic tortuosity and atherosclerosis. Numerous calcified central mesenteric lymph nodes, stable. No adenopathy. Reproductive: No visible focal abnormality. Other: Trace free fluid in the cul-de-sac.  No free air. Musculoskeletal: No acute bony abnormality. Severe diffuse chronic compression fractures throughout the lumbar and visualized lower thoracic spine. Some of progressed since prior study. IMPRESSION: Large stool burden throughout the colon with mild gaseous distention of the colon. Aortic atherosclerosis and tortuosity. Small amount of free fluid in the pelvis of unknown etiology. Numerous severe chronic compression fractures throughout the lumbar and lower thoracic spine, many progressed since prior study. Electronically Signed   By: Charlett NoseKevin  Dover M.D.   On: 09/11/2019 21:39   Dg Chest Portable 1  View  Result Date: 09/11/2019 CLINICAL DATA:  Right upper quadrant pain for months EXAM: PORTABLE CHEST 1 VIEW COMPARISON:  Radiograph 02/22/2015 FINDINGS: Mild tortuosity of the trachea is similar to comparison with slight accentuation due to a left anterior oblique positioning. The aorta is calcified. The remaining cardiomediastinal contours are unremarkable. Streaky areas of atelectasis in the lung bases. No consolidation, features of edema, pneumothorax, or effusion. Degenerative changes are present in the imaged spine and shoulders. Subacromial spurring in the right shoulder may predispose this patient to subacromial impingement syndromes. IMPRESSION: 1. Unchanged tracheal tortuosity 2. No acute cardiopulmonary disease. 3. Subacromial spurring in the right shoulder may predispose to subacromial impingement syndromes. Electronically Signed   By: Kreg ShropshirePrice  DeHay M.D.   On: 09/11/2019 21:03    Procedures Procedures (including critical care time)  Medications Ordered in ED Medications  sodium chloride flush (NS) 0.9 % injection 3 mL (3 mLs Intravenous Given 09/11/19 2059)  iohexol (OMNIPAQUE) 300 MG/ML solution 100 mL (100 mLs Intravenous Contrast Given 09/11/19 2110)     Initial Impression / Assessment and Plan / ED Course  I have reviewed the triage vital signs and the nursing notes.  Pertinent labs & imaging results that were available during my care of the patient were reviewed by me and considered in my medical decision making (see chart for details).  Clinical Course as of Sep 11 12  Thu Sep 11, 2019  2037 Labs reviewed.  No significant abnormalities.  Considering his degree of discomfort in the chronicity we will proceed with CT scan and chest x-ray.   [JK]    Clinical Course User Index [JK] Linwood DibblesKnapp, Laverne Hursey, MD     Patient presented to ED for evaluation of abdominal pain and back pain.  Symptoms have been ongoing for months.  He did have tenderness on abdominal exam.  Laboratory tests  were unremarkable.  CT scan does not show any acute abdominal pathology than a large stool burden.  This could be contributing to some of the symptoms but the CT scan also showed worsening compression fractures of the thoracic and lumbar spine.  Symptoms do seem to be more musculoskeletal in nature.  I suspect  this is the cause of his symptoms.  Patient has been managing his pain with Tylenol.  I recommended he follow-up with neurosurgery as an outpatient.  Patient and wife are comfortable with this plan.  Final Clinical Impressions(s) / ED Diagnoses   Final diagnoses:  Compression fracture of thoracic vertebra, initial encounter, unspecified thoracic vertebral level (HCC)  Compression fracture of L5 vertebra, initial encounter (HCC)  Constipation, unspecified constipation type    ED Discharge Orders    None       Linwood Dibbles, MD 09/12/19 [phone]

## 2019-09-11 NOTE — ED Triage Notes (Signed)
Pt endorses back pain and abd pain that has been going on for a few months. States the abd pain is when eating.

## 2019-09-12 NOTE — Discharge Instructions (Signed)
Continue taking the Tylenol as needed, follow-up with the spine doctor for further evaluation.  Try taking a stool softener to help with any constipation

## 2020-07-03 IMAGING — DX DG CHEST 1V PORT
1 series · 1 of 1 positions shown · non-contrast
Comparison: Radiograph 02/22/2015

CLINICAL DATA: Right upper quadrant pain for months

EXAM:
PORTABLE CHEST 1 VIEW

[chest ap]
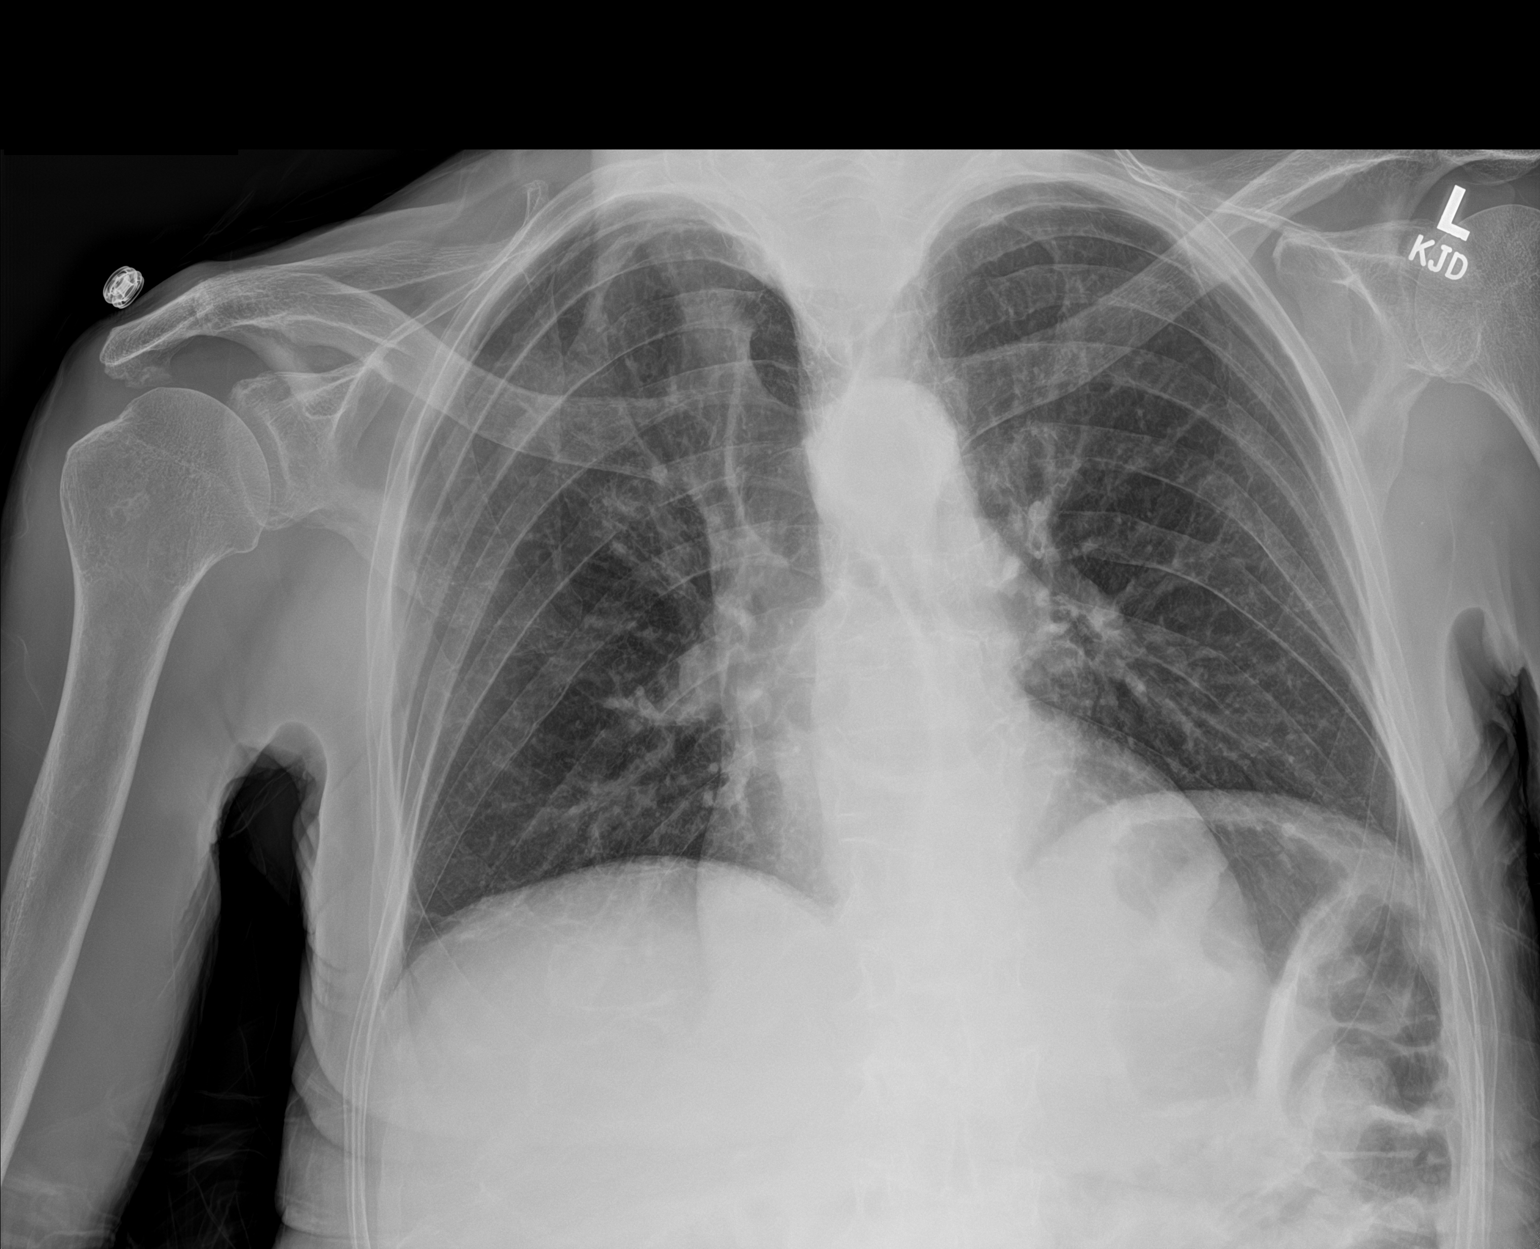

[1 of 1 positions shown; findings below may reference images not displayed]

FINDINGS: Mild tortuosity of the trachea is similar to comparison with slight
accentuation due to a left anterior oblique positioning. The aorta
is calcified. The remaining cardiomediastinal contours are
unremarkable. Streaky areas of atelectasis in the lung bases. No
consolidation, features of edema, pneumothorax, or effusion.
Degenerative changes are present in the imaged spine and shoulders.
Subacromial spurring in the right shoulder may predispose this
patient to subacromial impingement syndromes.
IMPRESSION: 1. Unchanged tracheal tortuosity
2. No acute cardiopulmonary disease.
3. Subacromial spurring in the right shoulder may predispose to
subacromial impingement syndromes.

## 2020-07-03 IMAGING — CT CT ABD-PELV W/ CM
2 of 5 series · 17 of 46 positions shown, 19 images · IV contrast (APPLIED)
Comparison: 02/22/2015

CLINICAL DATA: Abdominal pain

EXAM:
CT ABDOMEN AND PELVIS WITH CONTRAST
TECHNIQUE: Multidetector CT imaging of the abdomen and pelvis was performed
using the standard protocol following bolus administration of
intravenous contrast.
CONTRAST:  100mL OMNIPAQUE IOHEXOL 300 MG/ML  SOLN

[Series 3: abd/ pelvis 5.0 i30f 2 · axial · 0.72mm/px · z∈[+622,+967]mm · 14 of 79 slices shown, 16 images]
[im 5/79  soft-tissue]
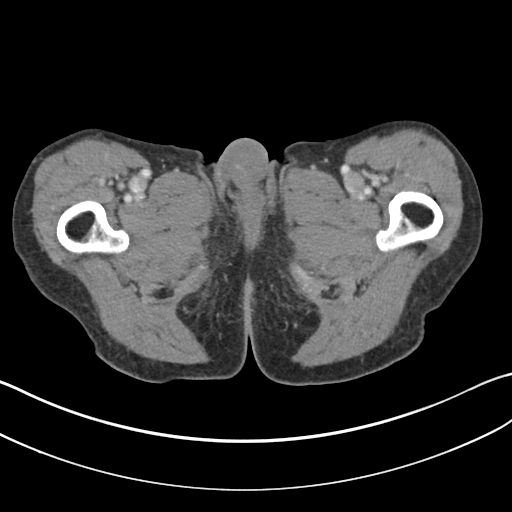
[im 5/79  bone]
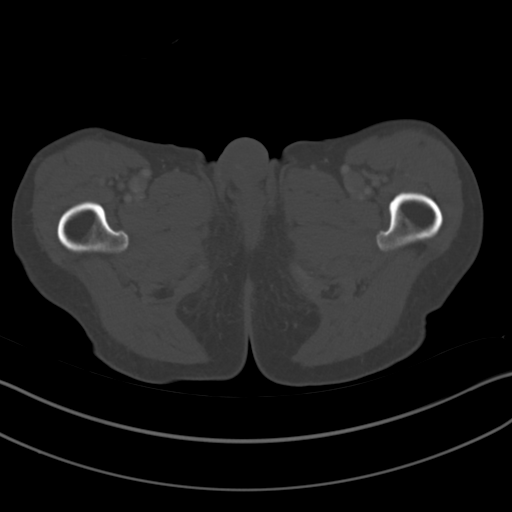
[im 9/79  soft-tissue]
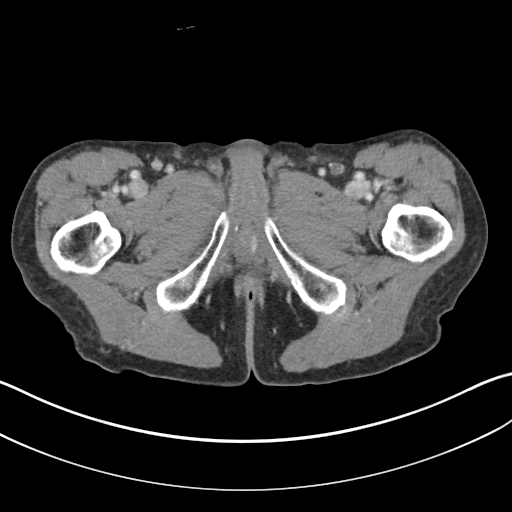
[im 17/79  soft-tissue]
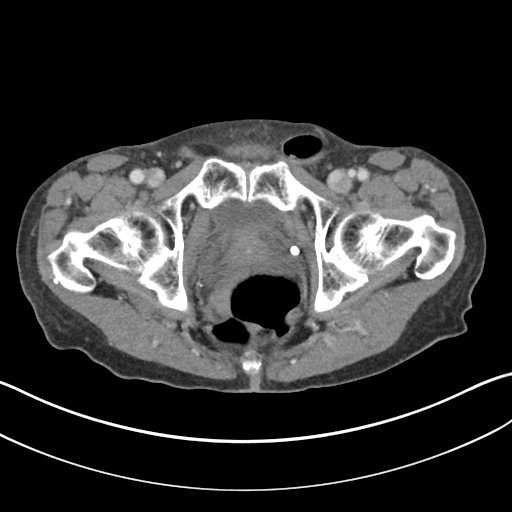
[im 21/79  soft-tissue]
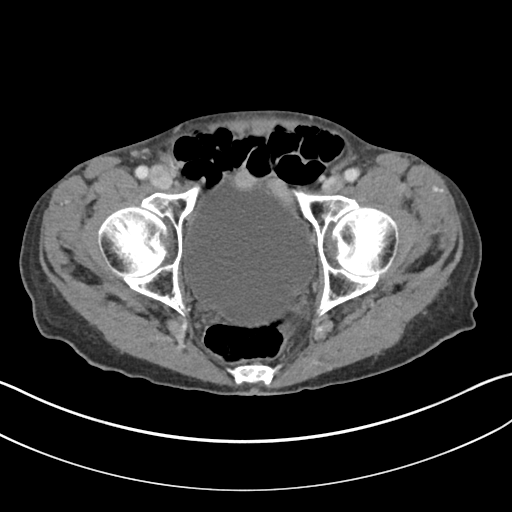
[im 25/79  soft-tissue]
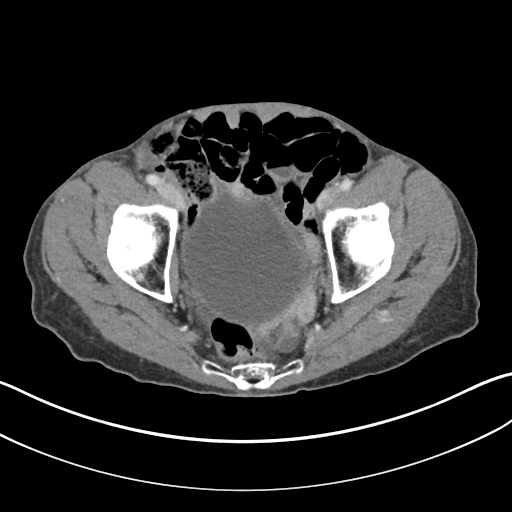
[im 33/79  soft-tissue]
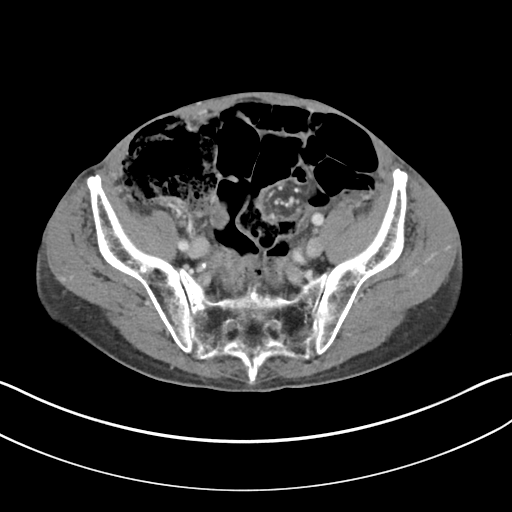
[im 37/79  soft-tissue]
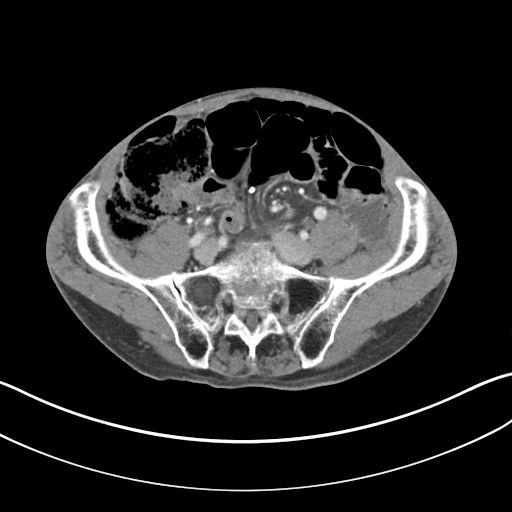
[im 42/79  soft-tissue]
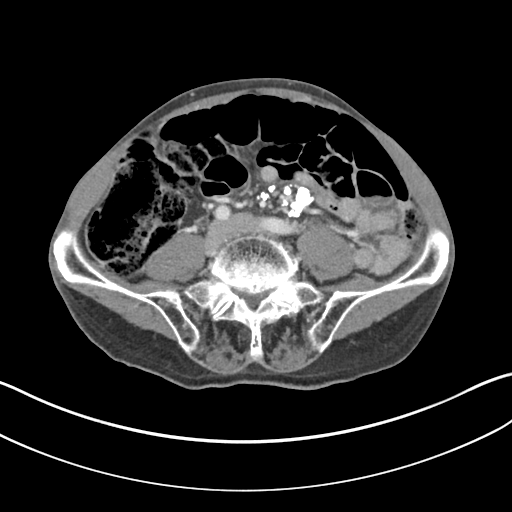
[im 46/79  soft-tissue]
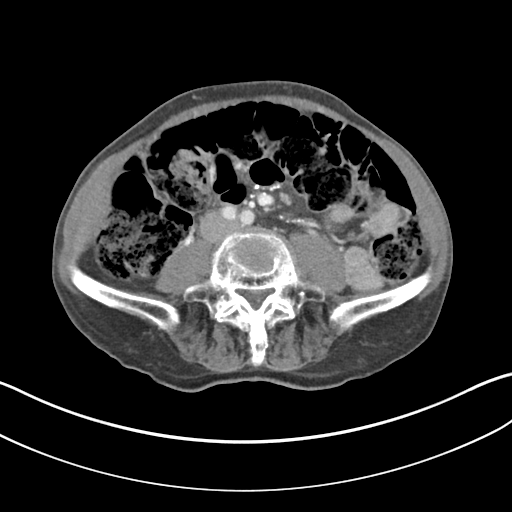
[im 46/79  bone]
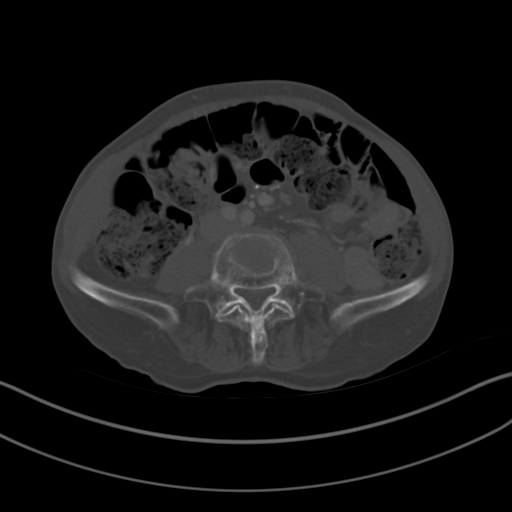
[im 54/79  soft-tissue]
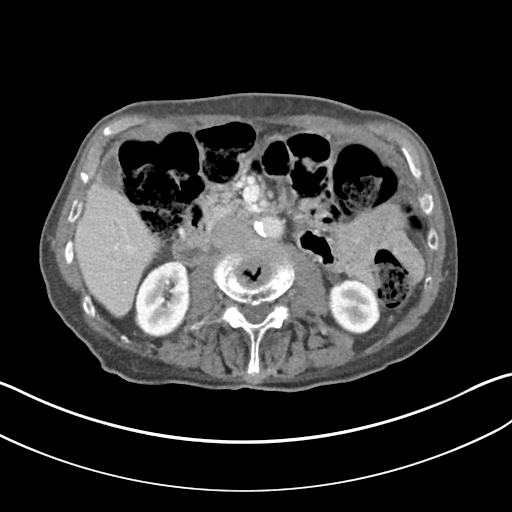
[im 58/79  soft-tissue]
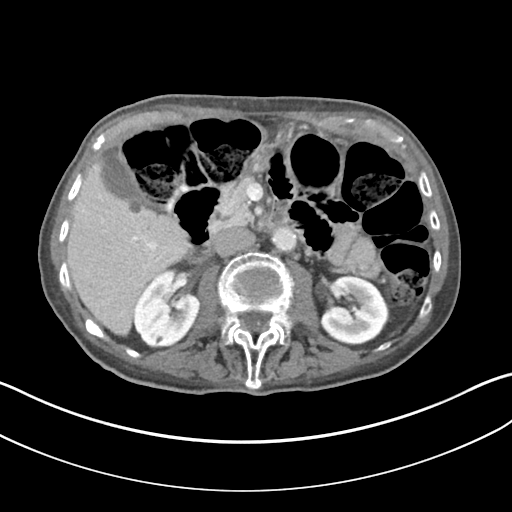
[im 62/79  soft-tissue]
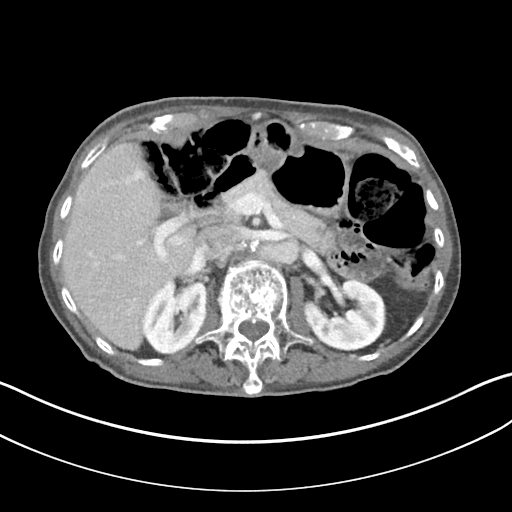
[im 70/79  soft-tissue]
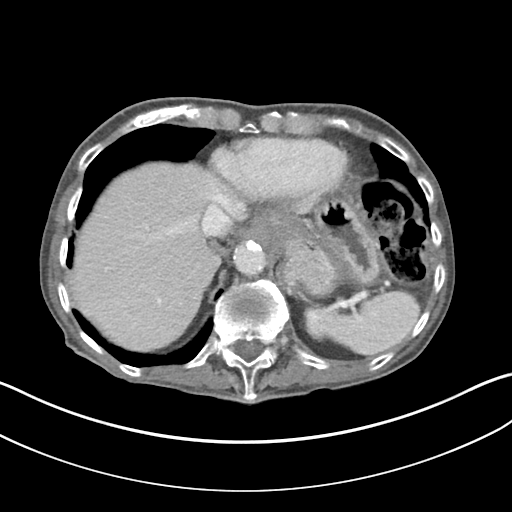
[im 74/79  soft-tissue]
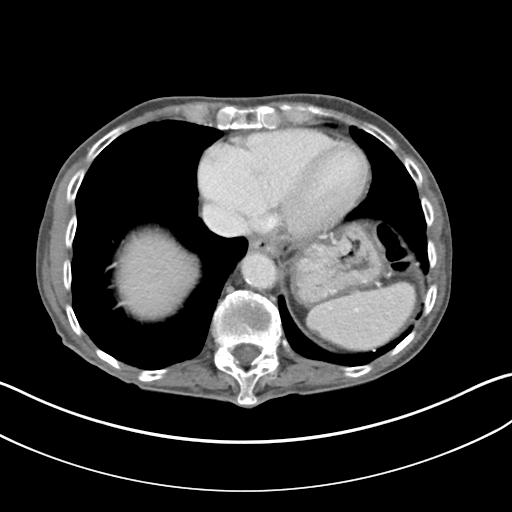

[Series 6: coronal soft tissue · coronal · 0.78mm/px · 3 of 92 slices shown]
[im 31/92  soft-tissue]
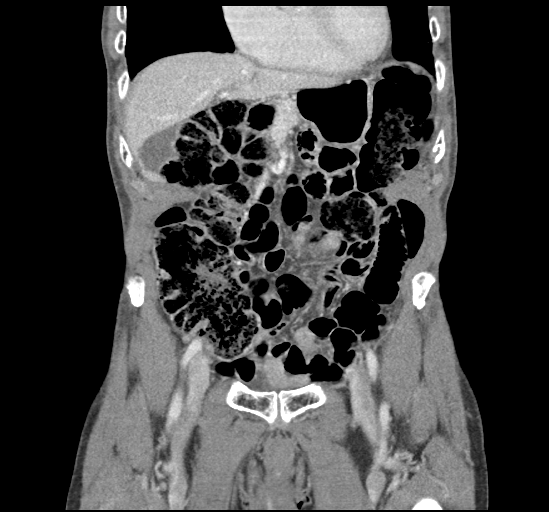
[im 41/92  soft-tissue]
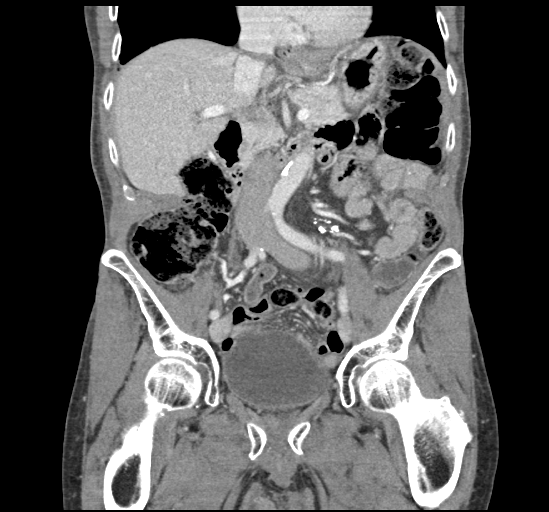
[im 51/92  soft-tissue]
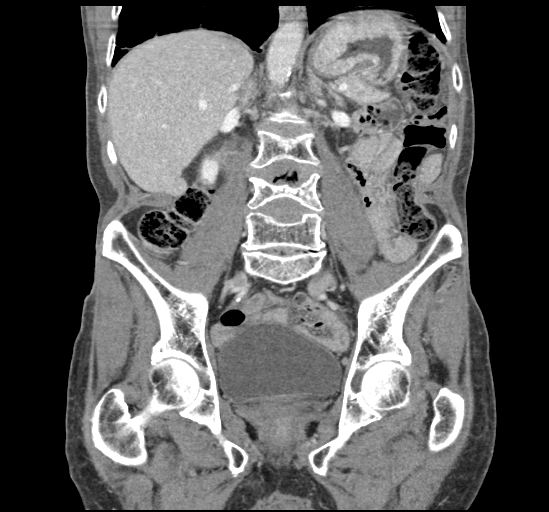

[17 of 46 positions shown; findings below may reference images not displayed]

FINDINGS: Lower chest: Calcified granuloma at the left base. No acute
abnormality.

Hepatobiliary: Scattered small hypodensities in the liver are
stable, likely small cysts. Gallbladder unremarkable.

Pancreas: No focal abnormality or ductal dilatation.

Spleen: No focal abnormality.  Normal size.

Adrenals/Urinary Tract: No adrenal abnormality. No focal renal
abnormality. No stones or hydronephrosis. Urinary bladder is
unremarkable.

Stomach/Bowel: Large stool burden throughout the colon with gaseous
distention of the colon. No evidence of bowel obstruction.

Vascular/Lymphatic: Aortic tortuosity and atherosclerosis. Numerous
calcified central mesenteric lymph nodes, stable. No adenopathy.

Reproductive: No visible focal abnormality.

Other: Trace free fluid in the cul-de-sac.  No free air.

Musculoskeletal: No acute bony abnormality. Severe diffuse chronic
compression fractures throughout the lumbar and visualized lower
thoracic spine. Some of progressed since prior study.
IMPRESSION: Large stool burden throughout the colon with mild gaseous distention
of the colon.

Aortic atherosclerosis and tortuosity.

Small amount of free fluid in the pelvis of unknown etiology.

Numerous severe chronic compression fractures throughout the lumbar
and lower thoracic spine, many progressed since prior study.
